# Patient Record
Sex: Female | Born: 1960 | Race: White | Hispanic: No | Marital: Married | Smoking: Former smoker
Health system: Southern US, Community
[De-identification: ages and names within clinical notes are randomized; demographics above are authoritative.]

## PROBLEM LIST (undated history)

## (undated) DIAGNOSIS — H3320 Serous retinal detachment, unspecified eye: Secondary | ICD-10-CM

## (undated) DIAGNOSIS — E7800 Pure hypercholesterolemia, unspecified: Secondary | ICD-10-CM

## (undated) DIAGNOSIS — E785 Hyperlipidemia, unspecified: Secondary | ICD-10-CM

## (undated) DIAGNOSIS — I4892 Unspecified atrial flutter: Secondary | ICD-10-CM

## (undated) HISTORY — PX: APPENDECTOMY: SHX54

## (undated) HISTORY — PX: TUBAL LIGATION: SHX77

## (undated) HISTORY — PX: TONSILLECTOMY: SUR1361

---
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
## 2004-04-17 ENCOUNTER — Other Ambulatory Visit: Admission: RE | Admit: 2004-04-17 | Discharge: 2004-04-17 | Payer: Self-pay | Admitting: Family Medicine

## 2005-03-27 ENCOUNTER — Emergency Department (HOSPITAL_COMMUNITY): Admission: EM | Admit: 2005-03-27 | Discharge: 2005-03-27 | Payer: Self-pay | Admitting: Emergency Medicine

## 2005-08-25 ENCOUNTER — Ambulatory Visit (HOSPITAL_COMMUNITY): Admission: RE | Admit: 2005-08-25 | Discharge: 2005-08-25 | Payer: Self-pay | Admitting: *Deleted

## 2005-12-01 ENCOUNTER — Encounter: Admission: RE | Admit: 2005-12-01 | Discharge: 2005-12-01 | Payer: Self-pay | Admitting: Family Medicine

## 2006-10-25 IMAGING — CR DG CHEST 1V PORT
1 series · 1 of 1 positions shown · non-contrast
Comparison: none

CLINICAL DATA: Chest pain. 
 PORTABLE CHEST - [id number] (7098 HOURS):
 No comparison.  
 Artifact overlies the chest.  Heart size is normal.   The mediastinum is unremarkable.   The lungs are clear.  No soft tissue or bony abnormality.

[view not recorded]
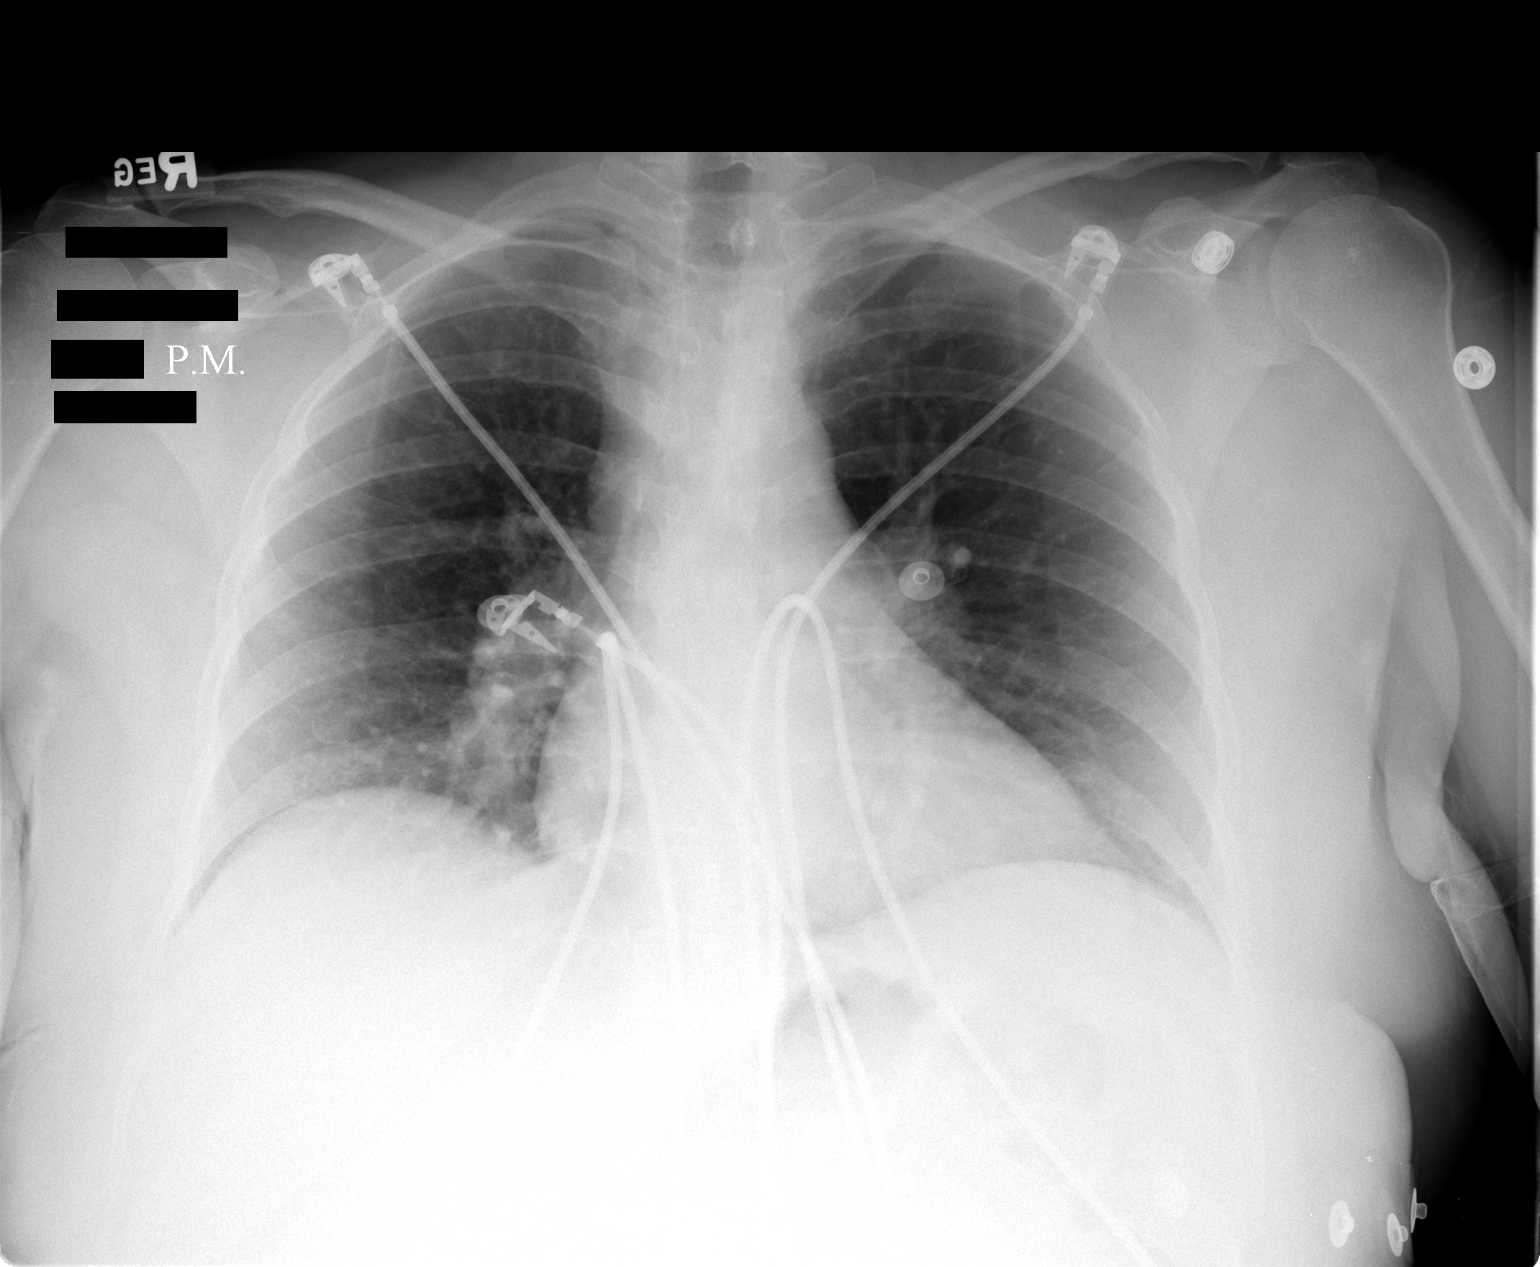

[1 of 1 positions shown; findings below may reference images not displayed]

IMPRESSION: 1.  No active disease.

## 2010-07-04 ENCOUNTER — Other Ambulatory Visit: Payer: Self-pay | Admitting: Family Medicine

## 2010-07-04 DIAGNOSIS — E049 Nontoxic goiter, unspecified: Secondary | ICD-10-CM

## 2010-07-07 ENCOUNTER — Ambulatory Visit
Admission: RE | Admit: 2010-07-07 | Discharge: 2010-07-07 | Disposition: A | Discharge status: Home / Self Care | Payer: Self-pay | Source: Ambulatory Visit | Attending: Family Medicine | Admitting: Family Medicine

## 2010-07-07 DIAGNOSIS — E049 Nontoxic goiter, unspecified: Secondary | ICD-10-CM

## 2010-08-01 DIAGNOSIS — J309 Allergic rhinitis, unspecified: Secondary | ICD-10-CM | POA: Insufficient documentation

## 2010-10-07 ENCOUNTER — Other Ambulatory Visit: Payer: Self-pay | Admitting: Family Medicine

## 2010-10-07 DIAGNOSIS — Z1231 Encounter for screening mammogram for malignant neoplasm of breast: Secondary | ICD-10-CM

## 2010-10-10 ENCOUNTER — Ambulatory Visit: Payer: BC Managed Care – PPO

## 2011-09-07 ENCOUNTER — Other Ambulatory Visit: Payer: Self-pay

## 2011-09-07 ENCOUNTER — Emergency Department (HOSPITAL_COMMUNITY)
Admission: EM | Admit: 2011-09-07 | Discharge: 2011-09-07 | Disposition: A | Discharge status: Home Health | Payer: BC Managed Care – PPO | Attending: Emergency Medicine | Admitting: Emergency Medicine

## 2011-09-07 ENCOUNTER — Encounter (HOSPITAL_COMMUNITY): Payer: Self-pay | Admitting: Emergency Medicine

## 2011-09-07 ENCOUNTER — Emergency Department (HOSPITAL_COMMUNITY): Payer: BC Managed Care – PPO

## 2011-09-07 DIAGNOSIS — I4891 Unspecified atrial fibrillation: Secondary | ICD-10-CM | POA: Insufficient documentation

## 2011-09-07 DIAGNOSIS — R0609 Other forms of dyspnea: Secondary | ICD-10-CM | POA: Insufficient documentation

## 2011-09-07 DIAGNOSIS — I498 Other specified cardiac arrhythmias: Secondary | ICD-10-CM | POA: Insufficient documentation

## 2011-09-07 DIAGNOSIS — E7800 Pure hypercholesterolemia, unspecified: Secondary | ICD-10-CM | POA: Insufficient documentation

## 2011-09-07 DIAGNOSIS — R42 Dizziness and giddiness: Secondary | ICD-10-CM | POA: Insufficient documentation

## 2011-09-07 DIAGNOSIS — R0989 Other specified symptoms and signs involving the circulatory and respiratory systems: Secondary | ICD-10-CM | POA: Insufficient documentation

## 2011-09-07 HISTORY — DX: Pure hypercholesterolemia, unspecified: E78.00

## 2011-09-07 LAB — BASIC METABOLIC PANEL
BUN: 15 mg/dL (ref 6–23)
Calcium: 9.7 mg/dL (ref 8.4–10.5)
Chloride: 105 mEq/L (ref 96–112)
Creatinine, Ser: 0.81 mg/dL (ref 0.50–1.10)
GFR calc Af Amer: >90 mL/min (ref >90)
Potassium: 4.1 mEq/L (ref 3.5–5.1)
Sodium: 141 mEq/L (ref 135–145)

## 2011-09-07 LAB — DIFFERENTIAL
Basophils Absolute: 0 10*3/uL (ref 0.0–0.1)
Basophils Relative: 0 % (ref 0–1)
Eosinophils Absolute: 0.1 10*3/uL (ref 0.0–0.7)
Lymphs Abs: 0.9 10*3/uL (ref 0.7–4.0)
Monocytes Absolute: 0.4 10*3/uL (ref 0.1–1.0)
Monocytes Relative: 5 % (ref 3–12)
Neutro Abs: 5.9 10*3/uL (ref 1.7–7.7)
Neutrophils Relative %: 80 % — ABNORMAL HIGH (ref 43–77)

## 2011-09-07 LAB — CBC
HCT: 42.9 % (ref 36.0–46.0)
Hemoglobin: 14.6 g/dL (ref 12.0–15.0)
MCHC: 34 g/dL (ref 30.0–36.0)
Platelets: 289 10*3/uL (ref 150–400)
RBC: 4.73 MIL/uL (ref 3.87–5.11)
RDW: 13.1 % (ref 11.5–15.5)
WBC: 7.4 10*3/uL (ref 4.0–10.5)

## 2011-09-07 LAB — CARDIAC PANEL(CRET KIN+CKTOT+MB+TROPI)
CK, MB: 2.8 ng/mL (ref 0.3–4.0)
Relative Index: INVALID (ref 0.0–2.5)
Total CK: 99 U/L (ref 7–177)
Troponin I: <0.3 ng/mL (ref <0.30)

## 2011-09-07 LAB — TSH: TSH: 0.969 u[IU]/mL (ref 0.350–4.500)

## 2011-09-07 MED ORDER — DILTIAZEM HCL 100 MG IV SOLR
5.0000 mg/h | Freq: Once | INTRAVENOUS | Status: AC
  Administered 2011-09-07: 5 mg/h via INTRAVENOUS
  Filled 2011-09-07: qty 100

## 2011-09-07 MED ORDER — ENOXAPARIN SODIUM 100 MG/ML ~~LOC~~ SOLN
100.0000 mg | SUBCUTANEOUS | Status: AC
  Administered 2011-09-07: 100 mg via SUBCUTANEOUS
  Filled 2011-09-07: qty 1

## 2011-09-07 MED ORDER — FLECAINIDE ACETATE 100 MG PO TABS
300.0000 mg | ORAL_TABLET | ORAL | Status: AC
  Administered 2011-09-07: 300 mg via ORAL
  Filled 2011-09-07: qty 3

## 2011-09-07 NOTE — ED Provider Notes (Signed)
History     CSN: [id number]  Arrival date & time 09/07/11  0865   First MD Initiated Contact with Patient 09/07/11 0848      Chief Complaint  Patient presents with  . Irregular Heart Beat    (Consider location/radiation/quality/duration/timing/severity/associated sxs/prior treatment) The history is provided by the patient.   51 year old female woke up from a nap at 4 PM yesterday and noted that her heart was racing and fluttering. There is associated dyspnea and lightheadedness. Dyspnea is worse with walking, but nothing affected the rapid heartbeat. She denies chest pain, heaviness, tightness, pressure. She denies nausea, vomiting, diaphoresis. She's never had anything like this before. Past medical history is significant only for elevated cholesterol. She relates that one time she had a slightly elevated thyroid test and was given vitamin and the repeat test was normal. Symptoms are described as moderate to severe. Nothing makes it better and nothing makes it worse.  Past Medical History  Diagnosis Date  . High cholesterol     No past surgical history on file.  No family history on file.  History  Substance Use Topics  . Smoking status: Never Smoker   . Smokeless tobacco: Not on file  . Alcohol Use: Yes    OB History    Grav Para Term Preterm Abortions TAB SAB Ect Mult Living                  Review of Systems  All other systems reviewed and are negative.    Allergies  Review of patient's allergies indicates not on file.  Home Medications  No current outpatient prescriptions on file.  BP 136/99  Pulse 152  Temp 98.1 F (36.7 C)  Resp 20  SpO2 100%  Physical Exam  Nursing note and vitals reviewed.  51 year old female is resting comfortably and in no acute distress. Vital signs are significant for tachycardia with heart rate 152, and borderline hypertension with blood pressure 136/99. Oxygen saturation is 100% which is normal. Head is normocephalic and  atraumatic. PERRLA, EOMI. Oropharynx is clear. Neck is nontender and supple without adenopathy or JVD. Back is nontender. Lungs are clear without rales, wheezes, or rhonchi. Heart is tachycardic without murmur. Abdomen is soft, flat, nontender without masses or hepatosplenomegaly. Extremities have trace edema, no cyanosis. Full range of motion is present. Skin is warm and dry without rash. Neurologic: Mental status is normal, cranial nerves are intact, there no focal motor or sensory deficits.  ED Course  Procedures (including critical care time)   Labs Reviewed  CBC  DIFFERENTIAL  BASIC METABOLIC PANEL  TSH  CARDIAC PANEL(CRET KIN+CKTOT+MB+TROPI)   No results found.   Date: 09/07/2011  Rate: 148  Rhythm: atrial fibrillation  QRS Axis: normal  Intervals: normal  ST/T Wave abnormalities: nonspecific ST changes  Conduction Disutrbances:none  Narrative Interpretation:   Old EKG Reviewed: none available  She received a dose of oral flecainide and was observed in the emergency department. She had successful conversion to sinus rhythm although was sinus tachycardia. She was observed in the emergency department and rhythm was stable and she will be discharged. TSH was ordered and results are not back yet. She's advised to make sure her primary care physician looked up this test result. She's also advised to discuss with her PCP what additional workup is needed and possible referral to a cardiologist.  1. Atrial fibrillation       MDM  New onset atrial fibrillation/atrial flutter. Symptoms have been present for less  than 24 hours so she would be a good candidate for flexion I conversion. She will be given to Diltiazem for rate control and and in the single dose of Lovenox and a dose of oral Flecainide and observed.        Dione Booze, MD 09/07/11 [phone]

## 2011-09-07 NOTE — ED Notes (Signed)
Discharge instructions reviewed with pt; verbalizes understanding.  No further c/o's voiced; No further questions asked.  Pt ambulatory to lobby with friend.

## 2011-09-07 NOTE — ED Notes (Signed)
Woke up from nap yesterday  Had fast heart rate over 135  Feels dizzy and sob

## 2011-09-07 NOTE — ED Notes (Addendum)
Pt from home.  Reports being tired yesterday, taking a nap and when she woke up, she had a racing HR.  Pt reports SOB, dizziness and lightheadedness.  Reports a fluttering in her throat and chest.  Denies CP, N/V/diaphoreis.  No distress noted.  Pt A/O x 4.  Pt nontender on palpation.

## 2011-09-07 NOTE — Discharge Instructions (Signed)
You had a blood test of your thyroid gland drawn today. Please make sure you're primary care Dr. Assunta Gambles up this blood test results. Make an appointment with your Dr. for followup. There are some other tests which need to be done to see if we can find a reason why you had irregular heartbeat today. If you have any problems, please return to the emergency department for further evaluation.  Atrial Fibrillation Your caregiver has diagnosed you with atrial fibrillation (AFib). The heart normally beats very regularly; AFib is a type of irregular heartbeat. The heart rate may be faster or slower than normal. This can prevent your heart from pumping as well as it should. AFib can be constant (chronic) or intermittent (paroxysmal). CAUSES  Atrial fibrillation may be caused by:  Heart disease, including heart attack, coronary artery disease, heart failure, diseases of the heart valves, and others.   Blood clot in the lungs (pulmonary embolism).   Pneumonia or other infections.   Chronic lung disease.   Thyroid disease.   Toxins. These include alcohol, some medications (such as decongestant medications or diet pills), and caffeine.  In some people, no cause for AFib can be found. This is referred to as Lone Atrial Fibrillation. SYMPTOMS   Palpitations or a fluttering in your chest.   A vague sense of chest discomfort.   Shortness of breath.   Sudden onset of lightheadedness or weakness.  Sometimes, the first sign of AFib can be a complication of the condition. This could be a stroke or heart failure. DIAGNOSIS  Your description of your condition may make your caregiver suspicious of atrial fibrillation. Your caregiver will examine your pulse to determine if fibrillation is present. An EKG (electrocardiogram) will confirm the diagnosis. Further testing may help determine what caused you to have atrial fibrillation. This may include chest x-ray, echocardiogram, blood tests, or CT scans. PREVENTION   If you have previously had atrial fibrillation, your caregiver may advise you to avoid substances known to cause the condition (such as stimulant medications, and possibly caffeine or alcohol). You may be advised to use medications to prevent recurrence. Proper treatment of any underlying condition is important to help prevent recurrence. PROGNOSIS  Atrial fibrillation does tend to become a chronic condition over time. It can cause significant complications (see below). Atrial fibrillation is not usually immediately life-threatening, but it can shorten your life expectancy. This seems to be worse in women. If you have lone atrial fibrillation and are under 14 years old, the risk of complications is very low, and life expectancy is not shortened. RISKS AND COMPLICATIONS  Complications of atrial fibrillation can include stroke, chest pain, and heart failure. Your caregiver will recommend treatments for the atrial fibrillation, as well as for any underlying conditions, to help minimize risk of complications. TREATMENT  Treatment for AFib is divided into several categories:  Treatment of any underlying condition.   Converting you out of AFib into a regular (sinus) rhythm.   Controlling rapid heart rate.   Prevention of blood clots and stroke.  Medications and procedures are available to convert your atrial fibrillation to sinus rhythm. However, recent studies have shown that this may not offer you any advantage, and cardiac experts are continuing research and debate on this topic. More important is controlling your rapid heartbeat. The rapid heartbeat causes more symptoms, and places strain on your heart. Your caregiver will advise you on the use of medications that can control your heart rate. Atrial fibrillation is a strong stroke  risk. You can lessen this risk by taking blood thinning medications such as Coumadin (warfarin), or sometimes aspirin. These medications need close monitoring by your  caregiver. Over-medication can cause bleeding. Too little medication may not protect against stroke. HOME CARE INSTRUCTIONS   If your caregiver prescribed medicine to make your heartbeat more normally, take as directed.   If blood thinners were prescribed by your caregiver, take EXACTLY as directed.   Perform blood tests EXACTLY as directed.   Quit smoking. Smoking increases your cardiac and lung (pulmonary) risks.   DO NOT drink alcohol.   DO NOT drink caffeinated drinks (e.g. coffee, soda, chocolate, and leaf teas). You may drink decaffeinated coffee, soda or tea.   If you are overweight, you should choose a reduced calorie diet to lose weight. Please see a registered dietitian if you need more information about healthy weight loss. DO NOT USE DIET PILLS as they may aggravate heart problems.   If you have other heart problems that are causing AFib, you may need to eat a low salt, fat, and cholesterol diet. Your caregiver will tell you if this is necessary.   Exercise every day to improve your physical fitness. Stay active unless advised otherwise.   If your caregiver has given you a follow-up appointment, it is very important to keep that appointment. Not keeping the appointment could result in heart failure or stroke. If there is any problem keeping the appointment, you must call back to this facility for assistance.  SEEK MEDICAL CARE IF:  You notice a change in the rate, rhythm or strength of your heartbeat.   You develop an infection or any other change in your overall health status.  SEEK IMMEDIATE MEDICAL CARE IF:   You develop chest pain, abdominal pain, sweating, weakness or feel sick to your stomach (nausea).   You develop shortness of breath.   You develop swollen feet and ankles.   You develop dizziness, numbness, or weakness of your face or limbs, or any change in vision or speech.  MAKE SURE YOU:   Understand these instructions.   Will watch your condition.    Will get help right away if you are not doing well or get worse.  Document Released: 04/20/2005 Document Revised: 04/09/2011 Document Reviewed: 11/23/2007 [facility] Patient Information 2012 [place], [place].

## 2011-09-08 ENCOUNTER — Observation Stay (HOSPITAL_COMMUNITY)
Admission: EM | Admit: 2011-09-08 | Discharge: 2011-09-10 | DRG: 139 | Disposition: A | Discharge status: Home / Self Care | Payer: BC Managed Care – PPO | Attending: Cardiovascular Disease | Admitting: Cardiovascular Disease

## 2011-09-08 ENCOUNTER — Encounter (HOSPITAL_COMMUNITY): Payer: Self-pay | Admitting: *Deleted

## 2011-09-08 DIAGNOSIS — Z8249 Family history of ischemic heart disease and other diseases of the circulatory system: Secondary | ICD-10-CM

## 2011-09-08 DIAGNOSIS — E785 Hyperlipidemia, unspecified: Secondary | ICD-10-CM

## 2011-09-08 DIAGNOSIS — F329 Major depressive disorder, single episode, unspecified: Secondary | ICD-10-CM | POA: Insufficient documentation

## 2011-09-08 DIAGNOSIS — I4892 Unspecified atrial flutter: Principal | ICD-10-CM | POA: Diagnosis present

## 2011-09-08 DIAGNOSIS — F3289 Other specified depressive episodes: Secondary | ICD-10-CM | POA: Insufficient documentation

## 2011-09-08 DIAGNOSIS — Z7982 Long term (current) use of aspirin: Secondary | ICD-10-CM | POA: Insufficient documentation

## 2011-09-08 DIAGNOSIS — F339 Major depressive disorder, recurrent, unspecified: Secondary | ICD-10-CM | POA: Diagnosis present

## 2011-09-08 HISTORY — DX: Unspecified atrial flutter: I48.92

## 2011-09-08 HISTORY — DX: Hyperlipidemia, unspecified: E78.5

## 2011-09-08 LAB — BASIC METABOLIC PANEL
CO2: 26 mEq/L (ref 19–32)
Calcium: 10.4 mg/dL (ref 8.4–10.5)
Chloride: 103 mEq/L (ref 96–112)
GFR calc Af Amer: >90 mL/min (ref >90)
GFR calc non Af Amer: 83 mL/min — ABNORMAL LOW (ref >90)
Potassium: 3.9 mEq/L (ref 3.5–5.1)
Sodium: 139 mEq/L (ref 135–145)

## 2011-09-08 LAB — HEPATIC FUNCTION PANEL
ALT: 20 U/L (ref 0–35)
AST: 16 U/L (ref 0–37)
Albumin: 4 g/dL (ref 3.5–5.2)
Alkaline Phosphatase: 60 U/L (ref 39–117)
Total Protein: 6.8 g/dL (ref 6.0–8.3)

## 2011-09-08 LAB — CBC
HCT: 43.9 % (ref 36.0–46.0)
MCH: 30.9 pg (ref 26.0–34.0)
MCHC: 34.4 g/dL (ref 30.0–36.0)
MCV: 90 fL (ref 78.0–100.0)
Platelets: 306 10*3/uL (ref 150–400)
RBC: 4.88 MIL/uL (ref 3.87–5.11)
RDW: 13 % (ref 11.5–15.5)
WBC: 6 10*3/uL (ref 4.0–10.5)

## 2011-09-08 LAB — MRSA PCR SCREENING: MRSA by PCR: NEGATIVE

## 2011-09-08 LAB — POCT I-STAT TROPONIN I: Troponin i, poc: 0 ng/mL (ref 0.00–0.08)

## 2011-09-08 LAB — CARDIAC PANEL(CRET KIN+CKTOT+MB+TROPI)
CK, MB: 2.3 ng/mL (ref 0.3–4.0)
Total CK: 56 U/L (ref 7–177)

## 2011-09-08 LAB — APTT: aPTT: 35 seconds (ref 24–37)

## 2011-09-08 MED ORDER — VENLAFAXINE HCL ER 150 MG PO CP24
150.0000 mg | ORAL_CAPSULE | Freq: Two times a day (BID) | ORAL | Status: DC
  Administered 2011-09-08 – 2011-09-10 (×4): 150 mg via ORAL
  Filled 2011-09-08 (×5): qty 1

## 2011-09-08 MED ORDER — SODIUM CHLORIDE 0.9 % IV BOLUS (SEPSIS)
500.0000 mL | Freq: Once | INTRAVENOUS | Status: AC
  Administered 2011-09-08: 500 mL via INTRAVENOUS

## 2011-09-08 MED ORDER — ACETAMINOPHEN 325 MG PO TABS: 650.0000 mg | ORAL_TABLET | ORAL | Status: DC | PRN

## 2011-09-08 MED ORDER — ASPIRIN 81 MG PO CHEW
324.0000 mg | CHEWABLE_TABLET | ORAL | Status: AC
  Administered 2011-09-08: 324 mg via ORAL
  Filled 2011-09-08: qty 4

## 2011-09-08 MED ORDER — METOPROLOL TARTRATE 1 MG/ML IV SOLN
5.0000 mg | Freq: Once | INTRAVENOUS | Status: AC
  Administered 2011-09-08: 5 mg via INTRAVENOUS
  Filled 2011-09-08: qty 5

## 2011-09-08 MED ORDER — DIGOXIN 0.25 MG/ML IJ SOLN
0.5000 mg | Freq: Once | INTRAMUSCULAR | Status: AC
  Administered 2011-09-08: 0.5 mg via INTRAVENOUS
  Filled 2011-09-08: qty 2

## 2011-09-08 MED ORDER — METOPROLOL TARTRATE 12.5 MG HALF TABLET
12.5000 mg | ORAL_TABLET | Freq: Two times a day (BID) | ORAL | Status: DC
  Administered 2011-09-08 – 2011-09-09 (×2): 12.5 mg via ORAL
  Filled 2011-09-08 (×3): qty 1

## 2011-09-08 MED ORDER — HEPARIN BOLUS VIA INFUSION
4000.0000 [IU] | Freq: Once | INTRAVENOUS | Status: AC
  Administered 2011-09-08: 4000 [IU] via INTRAVENOUS

## 2011-09-08 MED ORDER — ASPIRIN 300 MG RE SUPP
300.0000 mg | RECTAL | Status: AC
  Filled 2011-09-08: qty 1

## 2011-09-08 MED ORDER — ZOLPIDEM TARTRATE 5 MG PO TABS: 10.0000 mg | ORAL_TABLET | Freq: Every evening | ORAL | Status: DC | PRN

## 2011-09-08 MED ORDER — ALPRAZOLAM 0.25 MG PO TABS
0.2500 mg | ORAL_TABLET | Freq: Three times a day (TID) | ORAL | Status: DC | PRN
Start: 2011-09-08 — End: 2011-09-10

## 2011-09-08 MED ORDER — NITROGLYCERIN 0.4 MG SL SUBL: 0.4000 mg | SUBLINGUAL_TABLET | SUBLINGUAL | Status: DC | PRN

## 2011-09-08 MED ORDER — ASPIRIN EC 81 MG PO TBEC
81.0000 mg | DELAYED_RELEASE_TABLET | Freq: Every day | ORAL | Status: DC
  Administered 2011-09-09 – 2011-09-10 (×2): 81 mg via ORAL
  Filled 2011-09-08 (×2): qty 1

## 2011-09-08 MED ORDER — SODIUM CHLORIDE 0.9 % IV SOLN
INTRAVENOUS | Status: DC
  Administered 2011-09-08: 10 mL/h via INTRAVENOUS

## 2011-09-08 MED ORDER — DILTIAZEM LOAD VIA INFUSION
20.0000 mg | Freq: Once | INTRAVENOUS | Status: AC
  Administered 2011-09-08: 20 mg via INTRAVENOUS
  Filled 2011-09-08: qty 20

## 2011-09-08 MED ORDER — HEPARIN (PORCINE) IN NACL 100-0.45 UNIT/ML-% IJ SOLN
1600.0000 [IU]/h | INTRAMUSCULAR | Status: DC
  Administered 2011-09-08: 1350 [IU]/h via INTRAVENOUS
  Administered 2011-09-09: 1600 [IU]/h via INTRAVENOUS
  Filled 2011-09-08 (×3): qty 250

## 2011-09-08 MED ORDER — DILTIAZEM HCL 100 MG IV SOLR
5.0000 mg/h | INTRAVENOUS | Status: DC
  Administered 2011-09-08: 15 mg/h via INTRAVENOUS
  Administered 2011-09-08: 10 mg/h via INTRAVENOUS
  Administered 2011-09-08: 15 mg/h via INTRAVENOUS
  Administered 2011-09-08: 5 mg/h via INTRAVENOUS
  Administered 2011-09-09 (×2): 15 mg/h via INTRAVENOUS
  Filled 2011-09-08: qty 100

## 2011-09-08 MED ORDER — ONDANSETRON HCL 4 MG/2ML IJ SOLN: 4.0000 mg | Freq: Four times a day (QID) | INTRAMUSCULAR | Status: DC | PRN

## 2011-09-08 MED ORDER — DIGOXIN 250 MCG PO TABS
0.2500 mg | ORAL_TABLET | Freq: Every day | ORAL | Status: DC
  Administered 2011-09-09: 0.25 mg via ORAL
  Filled 2011-09-08: qty 1

## 2011-09-08 MED ORDER — FENOFIBRATE 160 MG PO TABS
160.0000 mg | ORAL_TABLET | Freq: Every day | ORAL | Status: DC
  Administered 2011-09-08 – 2011-09-09 (×2): 160 mg via ORAL
  Filled 2011-09-08 (×3): qty 1

## 2011-09-08 NOTE — ED Notes (Signed)
Patient states she was seen here yesterday for tachycardia.  She was sent home and was to go to her md today.  Patient states today she noted she felt like she was going to pass out and her heartrate was elevated.  She also reports she is feeling sob

## 2011-09-08 NOTE — ED Notes (Signed)
2926-01 Ready 

## 2011-09-08 NOTE — ED Notes (Signed)
Pt.heart  Rate gose up in 134 than drop down to 108

## 2011-09-08 NOTE — ED Notes (Signed)
Patient heartrate varies and up to 135,  Acuity changed

## 2011-09-08 NOTE — H&P (Signed)
[name] [name] is an 51 y.o. female.   PCP: Dr. Maryelizabeth Rowan  Chief Complaint: rapid heart rate with associated dizziness HPI:  51 year old white female presents back to the emergency room today with a flutter with rapid ventricular response and associated dizziness. She was in the emergency room yesterday after 24 hours of increased heart rate and not feeling well.  She was in atrial flutter was given flecinide oral bolus And ER physician documents returned to sinus rhythm.  At home she did have continued tachycardia she rested all day this morning until noon she was to see her primary care physician she stood up she was excessively dizzy and came to the emergency room. She is in atrial flutter with rapid ventricular response heart rate up to 150s at times. Chest no chest pressure but awareness of fluttering in her neck some shortness of breath this is also associated with this.  She has been given him 20 mg bolus of IV Cardizem drip currently at 5 mg and we will increase to 10 mg. She will receive 500 cc bolus of saline and IV heparin per pharmacy protocol.  Previously occasional flutter but no rapid heart rate episodes. No chest pain.  She does have a history of high cholesterol and a strong family history of coronary disease with a brother who died suddenly at 2 with an acute MI her father also died with a coronary artery disease and pneumonia but he had coronary artery bypass grafting and redo bypass grafting and multiple stents after the redo.     Past Medical History  Diagnosis Date  . High cholesterol   . Atrial flutter with rapid ventricular response 09/08/2011  . Hyperlipemia 09/08/2011    History reviewed. No pertinent past surgical history.  Family History  Problem Relation Age of Onset  . Stroke Mother   . Hypertension Mother   . Coronary artery disease Father   . Coronary artery disease Brother    Social History:  reports that she quit smoking about 10 years ago. She has  never used smokeless tobacco. She reports that she drinks about 1.2 ounces of alcohol per week. Her drug history not on file. She lives with a partner, she has 4 children. 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  Allergies:  Allergies  Allergen Reactions  . Wellbutrin (Bupropion) Anaphylaxis    Outpatient medications:  Fenfibrate Imdur 60 mg one at bedtime Effexor XR 150 mg 2 tablets daily  Results for orders placed during the hospital encounter of 09/08/11 (from the past 48 hour(s))  CBC     Status: Abnormal   Collection Time   09/08/11  1:45 PM      Component Value Range Comment   WBC 6.0  4.0 - 10.5 (K/uL)    RBC 4.88  3.87 - 5.11 (MIL/uL)    Hemoglobin 15.1 (*) 12.0 - 15.0 (g/dL)    HCT 45.4  09.8 - 11.9 (%)    MCV 90.0  78.0 - 100.0 (fL)    MCH 30.9  26.0 - 34.0 (pg)    MCHC 34.4  30.0 - 36.0 (g/dL)    RDW 14.7  82.9 - 56.2 (%)    Platelets 306  150 - 400 (K/uL)   BASIC METABOLIC PANEL     Status: Abnormal   Collection Time   09/08/11  1:45 PM      Component Value Range Comment   Sodium 139  135 - 145 (mEq/L)    Potassium 3.9  3.5 - 5.1 (mEq/L)    Chloride 103  96 - 112 (mEq/L)    CO2 26  19 - 32 (mEq/L)    Glucose, Bld 97  70 - 99 (mg/dL)    BUN 13  6 - 23 (mg/dL)    Creatinine, Ser 1.61  0.50 - 1.10 (mg/dL)    Calcium 09.6  8.4 - 10.5 (mg/dL)    GFR calc non Af Amer 83 (*) >90 (mL/min)    GFR calc Af Amer >90  >90 (mL/min)   POCT I-STAT TROPONIN I     Status: Normal   Collection Time   09/08/11  2:10 PM      Component Value Range Comment   Troponin i, poc 0.00  0.00 - 0.08 (ng/mL)    Comment 3             Dg Chest Portable 1 View  09/07/2011  *RADIOLOGY REPORT*  Clinical Data: Irregular heart beat  CHEST - 1 VIEW  Comparison:  03/27/2005  Findings: The heart size and mediastinal contours are within normal limits.  Both lungs are clear.  IMPRESSION: No active disease.  Original Report Authenticated By: Judie Petit.  Ruel Favors, M.D.    ROS: General:No recent colds or fevers Skin:No rashes or ulcers HEENT:No blurred vision or double vision CV:No chest pain but awareness of rapid heart rate EAV:WUJWJXBJY of breath is present with rapid heart rate but otherwise has not had shortness of breath GI:No diarrhea constipation or melena GU:No dysuria or hematuria MS:No musculoskeletal issues or joint pain Neuro:Positive dizziness today but no syncope Endo:No diabetes or thyroid disease    Blood pressure 119/79, pulse 140, temperature 98.4 F (36.9 C), temperature source Oral, resp. rate 21, height 5\' 7"  (1.702 m), weight 97.523 kg (215 lb), SpO2 96.00%. PE: General:Alert and oriented x3, pleasant affect no acute distress but obviously does not feel well Skin:Warm and dry brisk capillary refill HEENT:Normocephalic, sclera clear Neck:Supple no JVD no bruits Heart:S1-S2 rapid rate and irregular Lungs:Clear without rales rhonchi or wheezes NWG:NFAOZHYQ bowel sounds do not palpate liver spleen or masses Ext:No lower extremity edema 2+ pedal pulses bilaterally Neuro:Alert and oriented x3 follows commands moves all extremities    Assessment/Plan Patient Active Problem List  Diagnoses  . Atrial flutter with rapid ventricular response  . Hyperlipemia   PLAN: IV cardizem, Heparin, slow rate and plan for TEE/DCCV in AM,  Serial ckmbs.  MVHQIO,NGEXB R 09/08/2011, 4:31 PM   Agree with note written by Nada Boozer RNP  Agree with LI RNP. Pt with new onset aflutter with 2:1 conduction. CRF + for lipids and strong family history. She was seen in ER yesterday and converted with po flecainide. Symptoms recurred today. Denies cp or SOB. Exam benign. EKG aflutter. Labs OK. Plan to slow with iv dilt and BB, put on iv hep and cycle enzymes. Wll do TEE DCCV tomorrow. Will ultimately need to be put on BB and have an OP ischemia w/u (myoview).  Runell Gess 09/08/2011 6:20 PM

## 2011-09-08 NOTE — Progress Notes (Signed)
ANTICOAGULATION CONSULT NOTE - Initial Consult  Pharmacy Consult for heparin Indication: atrial fibrillation  Allergies  Allergen Reactions  . Wellbutrin (Bupropion) Anaphylaxis    Patient Measurements: Height: 5\' 7"  (170.2 cm) Weight: 215 lb (97.523 kg) IBW/kg (Calculated) : 61.6  Heparin Dosing Weight: 83.2 kg  Vital Signs: Temp: 98.4 F (36.9 C) (05/07 1241) Temp src: Oral (05/07 1241) BP: 119/79 mmHg (05/07 1607) Pulse Rate: 140  (05/07 1607)  Labs:  Basename 09/08/11 1345 09/07/11 0907  HGB 15.1* 14.6  HCT 43.9 42.9  PLT 306 289  APTT -- --  LABPROT -- --  INR -- --  HEPARINUNFRC -- --  CREATININE 0.81 0.81  CKTOTAL -- 99  CKMB -- 2.8  TROPONINI -- <0.30    Estimated Creatinine Clearance: 98.6 ml/min (by C-G formula based on Cr of 0.81).   Medical History: Past Medical History  Diagnosis Date  . High cholesterol   . Atrial flutter with rapid ventricular response 09/08/2011  . Hyperlipemia 09/08/2011    Medications:  Fenofibrate 160 mg qhs Effexor XR 150 mg bid   Assessment: 51 yo female in ED yesterday with afib and got lovenox 100mg  5/6 at 1030am back in the ED with symptomatic afib with lightheadedness, elevated heart rate and SOB to start IV heparin per pharmacy protocol.  CBC wnl.    Goal of Therapy:  Heparin level 0.3 - 0.7    Plan:  1. Heparin 4000 unit bolus 2. Heparin drip at 1350 units/hr 3. Check heparin level in 6 hours or with next cardiac enzyme 4. Daily heparin level and daily CBC with platelet count while on heaprin Herby Abraham, Pharm.D. [phone] 09/08/2011 4:52 PM    Len Childs T 09/08/2011,4:41 PM

## 2011-09-08 NOTE — ED Provider Notes (Signed)
History     CSN: [id number]  Arrival date & time 09/08/11  1239   First MD Initiated Contact with Patient 09/08/11 1325      Chief Complaint  Patient presents with  . Tachycardia  . Near Syncope    (Consider location/radiation/quality/duration/timing/severity/associated sxs/prior treatment) The history is provided by the patient, a friend and medical records.  51 y/o F with PMH HLD and new diagnosis of atrial fibrillation in the emergency department yesterday presents to the emergency department with complaints of lightheadedness, elevated heart rate, and shortness of breath with exertion. There is associated mild heaviness in the chest but she feels is secondary to her shortness of breath. She denies any chest pain. Denies nausea, vomiting, or diaphoresis. Nothing in particular makes her symptoms better or worse. Prior record indicates that she was given diltiazem in the emergency department yesterday for cardioversion. She was given an oral dose of flecainide as well as a single dose of Lovenox. TSH was checked as patient has a history of hyperthyroid, and is found to be normal. Was advised to follow up with her primary care Dr. Patient reports that she he was supposed to see her doctor at 1 PM today but did not feel well and go to the appointment.   Past Medical History  Diagnosis Date  . High cholesterol     History reviewed. No pertinent past surgical history.  No family history on file.  History  Substance Use Topics  . Smoking status: Never Smoker   . Smokeless tobacco: Not on file  . Alcohol Use: Yes     Review of Systems 10 systems reviewed and are negative for acute change except as noted in the HPI.  Allergies  Wellbutrin  Home Medications   Current Outpatient Rx  Name Route Sig Dispense Refill  . FENOFIBRATE 160 MG PO TABS Oral Take 160 mg by mouth at bedtime.     . VENLAFAXINE HCL ER 150 MG PO CP24 Oral Take 150 mg by mouth 2 (two) times daily.      BP  120/66  Pulse 101  Temp(Src) 98.4 F (36.9 C) (Oral)  Resp 21  Ht 5\' 7"  (1.702 m)  Wt 215 lb (97.523 kg)  BMI 33.67 kg/m2  SpO2 95%  Physical Exam  Constitutional: She is oriented to person, place, and time. She appears well-developed and well-nourished. No distress.  HENT:  Head: Normocephalic and atraumatic.  Right Ear: External ear normal.  Left Ear: External ear normal.  Mouth/Throat: Oropharynx is clear and moist.  Eyes: Conjunctivae are normal. Pupils are equal, round, and reactive to light.  Neck: Neck supple.  Cardiovascular: Intact distal pulses.  Tachycardia present.        Rhythm generally regular with occasional extra beat heard  Pulmonary/Chest: Effort normal and breath sounds normal. No respiratory distress. She has no wheezes. She exhibits no tenderness.  Abdominal: Soft. Bowel sounds are normal. She exhibits no distension. There is no tenderness.  Musculoskeletal: She exhibits no edema and no tenderness.       Calves supple and non-tender  Neurological: She is alert and oriented to person, place, and time.  Skin: Skin is warm and dry.    ED Course  Procedures (including critical care time)  Labs Reviewed  CBC - Abnormal; Notable for the following:    Hemoglobin 15.1 (*)    All other components within normal limits  BASIC METABOLIC PANEL - Abnormal; Notable for the following:    GFR calc non Af  Amer 60 (*)    All other components within normal limits  POCT I-STAT TROPONIN I   Dg Chest Portable 1 View  09/07/2011  *RADIOLOGY REPORT*  Clinical Data: Irregular heart beat  CHEST - 1 VIEW  Comparison:  03/27/2005  Findings: The heart size and mediastinal contours are within normal limits.  Both lungs are clear.  IMPRESSION: No active disease.  Original Report Authenticated By: Judie Petit. Ruel Favors, M.D.    Date: 09/08/2011, 12:43 pm  Rate: 145  Rhythm: atrial flutter  QRS Axis: normal  Intervals: normal  ST/T Wave abnormalities: nonspecific ST changes  Conduction  Disutrbances:none  Old EKG Reviewed: unchanged from 09/07/2011   Date: 09/08/2011, 3:12pm  Rate: 141  Rhythm: atrial flutter  QRS Axis: normal  Intervals: normal  ST/T Wave abnormalities: nonspecific ST changes  Conduction Disutrbances:none  Old EKG Reviewed: unchanged      Dx 1: Atrial flutter   MDM  A-fib dx yesterday. EKG today more c/w a-flutter. Cardizem bolus/drip ordered with initial rate improvement but soon returned to 140s, with regular rhythm, c/w NSR vs 2:1 flutter. Labs today unremarkable. Spoke with laura, NP for [facility], who will see pt in ED.        Shaaron Adler, [place] 09/08/11 1542

## 2011-09-09 ENCOUNTER — Encounter (HOSPITAL_COMMUNITY): Payer: Self-pay | Admitting: Anesthesiology

## 2011-09-09 ENCOUNTER — Ambulatory Visit (HOSPITAL_COMMUNITY): Admit: 2011-09-09 | Payer: Self-pay | Admitting: Cardiovascular Disease

## 2011-09-09 ENCOUNTER — Encounter (HOSPITAL_COMMUNITY): Payer: Self-pay

## 2011-09-09 ENCOUNTER — Encounter (HOSPITAL_COMMUNITY)
Admission: EM | Disposition: A | Discharge status: Home / Self Care | Payer: Self-pay | Source: Home / Self Care | Attending: Emergency Medicine

## 2011-09-09 ENCOUNTER — Inpatient Hospital Stay (HOSPITAL_COMMUNITY): Payer: BC Managed Care – PPO | Admitting: Anesthesiology

## 2011-09-09 DIAGNOSIS — F339 Major depressive disorder, recurrent, unspecified: Secondary | ICD-10-CM | POA: Diagnosis present

## 2011-09-09 DIAGNOSIS — Z8249 Family history of ischemic heart disease and other diseases of the circulatory system: Secondary | ICD-10-CM

## 2011-09-09 HISTORY — PX: TEE WITHOUT CARDIOVERSION: SHX5443

## 2011-09-09 LAB — CARDIAC PANEL(CRET KIN+CKTOT+MB+TROPI)
CK, MB: 1.9 ng/mL (ref 0.3–4.0)
Relative Index: INVALID (ref 0.0–2.5)
Total CK: 51 U/L (ref 7–177)
Total CK: 52 U/L (ref 7–177)
Troponin I: <0.3 ng/mL (ref <0.30)

## 2011-09-09 LAB — BASIC METABOLIC PANEL
BUN: 15 mg/dL (ref 6–23)
CO2: 27 mEq/L (ref 19–32)
Glucose, Bld: 100 mg/dL — ABNORMAL HIGH (ref 70–99)
Potassium: 4.3 mEq/L (ref 3.5–5.1)
Sodium: 142 mEq/L (ref 135–145)

## 2011-09-09 LAB — LIPID PANEL
Cholesterol: 280 mg/dL — ABNORMAL HIGH (ref 0–200)
Triglycerides: 217 mg/dL — ABNORMAL HIGH (ref <150)

## 2011-09-09 LAB — HEPARIN LEVEL (UNFRACTIONATED)
Heparin Unfractionated: 0.26 IU/mL — ABNORMAL LOW (ref 0.30–0.70)
Heparin Unfractionated: 0.71 IU/mL — ABNORMAL HIGH (ref 0.30–0.70)

## 2011-09-09 LAB — HEMOGLOBIN A1C: Mean Plasma Glucose: 131 mg/dL — ABNORMAL HIGH (ref <117)

## 2011-09-09 LAB — CBC
HCT: 43.5 % (ref 36.0–46.0)
Hemoglobin: 14.9 g/dL (ref 12.0–15.0)
MCH: 31.1 pg (ref 26.0–34.0)
RBC: 4.79 MIL/uL (ref 3.87–5.11)

## 2011-09-09 SURGERY — ECHOCARDIOGRAM, TRANSESOPHAGEAL
Anesthesia: Moderate Sedation

## 2011-09-09 MED ORDER — HEPARIN BOLUS VIA INFUSION
2000.0000 [IU] | Freq: Once | INTRAVENOUS | Status: AC
  Administered 2011-09-09: 2000 [IU] via INTRAVENOUS
  Filled 2011-09-09: qty 2000

## 2011-09-09 MED ORDER — SODIUM CHLORIDE 0.9 % IJ SOLN
3.0000 mL | Freq: Two times a day (BID) | INTRAMUSCULAR | Status: DC
  Administered 2011-09-09 (×2): 3 mL via INTRAVENOUS

## 2011-09-09 MED ORDER — FENTANYL CITRATE 0.05 MG/ML IJ SOLN
INTRAMUSCULAR | Status: DC | PRN
  Administered 2011-09-09 (×2): 25 ug via INTRAVENOUS

## 2011-09-09 MED ORDER — BUTAMBEN-TETRACAINE-BENZOCAINE 2-2-14 % EX AERO
INHALATION_SPRAY | CUTANEOUS | Status: DC | PRN
  Administered 2011-09-09: 2 via TOPICAL

## 2011-09-09 MED ORDER — METOPROLOL TARTRATE 25 MG PO TABS
25.0000 mg | ORAL_TABLET | Freq: Two times a day (BID) | ORAL | Status: DC
  Administered 2011-09-09 – 2011-09-10 (×2): 25 mg via ORAL
  Filled 2011-09-09 (×3): qty 1

## 2011-09-09 MED ORDER — SODIUM CHLORIDE 0.9 % IV SOLN
INTRAVENOUS | Status: DC | PRN
  Administered 2011-09-09: 14:00:00 via INTRAVENOUS

## 2011-09-09 MED ORDER — FENTANYL CITRATE 0.05 MG/ML IJ SOLN
INTRAMUSCULAR | Status: AC
  Filled 2011-09-09: qty 2

## 2011-09-09 MED ORDER — MIDAZOLAM HCL 10 MG/2ML IJ SOLN
INTRAMUSCULAR | Status: DC | PRN
  Administered 2011-09-09 (×2): 2 mg via INTRAVENOUS
  Administered 2011-09-09: 1 mg via INTRAVENOUS

## 2011-09-09 MED ORDER — HEPARIN (PORCINE) IN NACL 100-0.45 UNIT/ML-% IJ SOLN
1450.0000 [IU]/h | INTRAMUSCULAR | Status: DC
  Administered 2011-09-09: 1450 [IU]/h via INTRAVENOUS
  Filled 2011-09-09 (×2): qty 250

## 2011-09-09 MED ORDER — MIDAZOLAM HCL 10 MG/2ML IJ SOLN
INTRAMUSCULAR | Status: AC
  Filled 2011-09-09: qty 2

## 2011-09-09 NOTE — Progress Notes (Signed)
Pt 's HR remains stubbornly in 130's at rest post titrating cardizem to max dose (15mg ), on call MD (L. Kilroy) notified. New order received. Will continue to monitor.

## 2011-09-09 NOTE — Anesthesia Postprocedure Evaluation (Signed)
  Anesthesia Post-op Note  Patient: [name] [name]  Procedure(s) Performed: Procedure(s) (LRB): TRANSESOPHAGEAL ECHOCARDIOGRAM (TEE) (N/A)  Patient Location: Endoscopy  Anesthesia Type: General  Level of Consciousness: awake, alert  and oriented  Airway and Oxygen Therapy: Patient Spontanous Breathing and Patient connected to nasal cannula oxygen  Post-op Pain: none  Post-op Assessment: Post-op Vital signs reviewed and Patient's Cardiovascular Status Stable  Post-op Vital Signs: stable  Complications: No apparent anesthesia complications

## 2011-09-09 NOTE — Progress Notes (Signed)
ANTICOAGULATION CONSULT NOTE   Pharmacy Consult for heparin Indication: atrial fibrillation  Allergies  Allergen Reactions  . Wellbutrin (Bupropion) Anaphylaxis    Patient Measurements: Height: 5\' 7"  (170.2 cm) Weight: 210 lb 15.7 oz (95.7 kg) IBW/kg (Calculated) : 61.6  Heparin Dosing Weight: 83.2 kg  Vital Signs: Temp: 98.2 F (36.8 C) (05/07 2345) Temp src: Oral (05/07 2345) BP: 97/51 mmHg (05/08 0000) Pulse Rate: 141  (05/07 2131)  Labs:  Basename 09/09/11 0001 09/08/11 1615 09/08/11 1614 09/08/11 1345 09/07/11 0907  HGB -- -- -- 15.1* 14.6  HCT -- -- -- 43.9 42.9  PLT -- -- -- 306 289  APTT -- -- 35 -- --  LABPROT -- -- 12.9 -- --  INR -- -- 0.95 -- --  HEPARINUNFRC 0.26* -- -- -- --  CREATININE -- -- -- 0.81 0.81  CKTOTAL 52 56 -- -- 99  CKMB 2.0 2.3 -- -- 2.8  TROPONINI <0.30 <0.30 -- -- <0.30    Estimated Creatinine Clearance: 97.5 ml/min (by C-G formula based on Cr of 0.81).  Assessment: 51 yo female with afib for heparin.  Goal of Therapy:  Heparin level 0.3 - 0.7    Plan:  Heparin 2000 units IV bolus, then increase heparin 1600 units/hr Follow-up am labs.  Cyree Chuong, Gary Fleet 09/09/2011,12:55 AM

## 2011-09-09 NOTE — OR Nursing (Signed)
1408 Anesthesia in to sedate for cardioversion.

## 2011-09-09 NOTE — Transfer of Care (Signed)
Immediate Anesthesia Transfer of Care Note  Patient: [name] [name]  Procedure(s) Performed: Procedure(s) (LRB): TRANSESOPHAGEAL ECHOCARDIOGRAM (TEE) (N/A)  Patient Location: PACU and Endoscopy Unit  Anesthesia Type: MAC  Level of Consciousness: awake, alert  and oriented  Airway & Oxygen Therapy: Patient Spontanous Breathing and Patient connected to nasal cannula oxygen  Post-op Assessment: Report given to PACU RN and Post -op Vital signs reviewed and stable  Post vital signs: Reviewed and stable  Complications: No apparent anesthesia complications

## 2011-09-09 NOTE — CV Procedure (Signed)
[name] [name],[name] [name] Female, 51 y.o., [birth year]  MRN: [id number]  Procedure: Electrical Cardioversion Indications:  Atrial Flutter  Procedure Details:  Consent: Risks of procedure as well as the alternatives and risks of each were explained to the (patient/caregiver).  Consent for procedure obtained.  Time Out: Verified patient identification, verified procedure, site/side was marked, verified correct patient position, special equipment/implants available, medications/allergies/relevent history reviewed, required imaging and test results available.  Performed  Patient placed on cardiac monitor, pulse oximetry, supplemental oxygen as necessary.  Sedation given: Benzodiazepines and opiates for TEE, Propofol IV courtesy of Dr. Janean Sark, Anesthesiology. Pacer pads placed anterior and posterior chest.  Cardioverted 1 time(s).  Cardioverted at 75J. Synchronized biphasic.  Evaluation: Findings: Post procedure EKG shows: NSR 60 bpm. Complications: None Patient did tolerate procedure well.  Time Spent Directly with the Patient:  30 minutes   [name] [name] 09/09/2011, 2:49 PM

## 2011-09-09 NOTE — Progress Notes (Signed)
*  PRELIMINARY RESULTS* Echocardiogram Echocardiogram Transesophageal has been performed.  Glean Salen [facility] 09/09/2011, 3:03 PM

## 2011-09-09 NOTE — ED Provider Notes (Signed)
Medical screening examination/treatment/procedure(s) were performed by non-physician practitioner and as supervising physician I was immediately available for consultation/collaboration.    Nelia Shi, MD 09/09/11 0730

## 2011-09-09 NOTE — Anesthesia Postprocedure Evaluation (Signed)
  Anesthesia Post-op Note  Patient: [name] [name]  Procedure(s) Performed: Procedure(s) (LRB): TRANSESOPHAGEAL ECHOCARDIOGRAM (TEE) (N/A)  Patient Location: PACU and Endoscopy Unit  Anesthesia Type: MAC  Level of Consciousness: awake, alert  and oriented  Airway and Oxygen Therapy: Patient Spontanous Breathing and Patient connected to nasal cannula oxygen  Post-op Pain: none  Post-op Assessment: Post-op Vital signs reviewed, Patient's Cardiovascular Status Stable, Respiratory Function Stable, Patent Airway and No signs of Nausea or vomiting  Post-op Vital Signs: Reviewed and stable  Complications: No apparent anesthesia complications

## 2011-09-09 NOTE — CV Procedure (Signed)
[name],[name] Female, 51 y.o., 1960/08/13  MRN: [id number]  INDICATIONS: Atrial flutter  PROCEDURE:   Informed consent was obtained prior to the procedure. The risks, benefits and alternatives for the procedure were discussed and the patient comprehended these risks.  Risks include, but are not limited to, cough, sore throat, vomiting, nausea, somnolence, esophageal and stomach trauma or perforation, bleeding, low blood pressure, aspiration, pneumonia, infection, trauma to the teeth and death.    After a procedural time-out, the oropharynx was anesthetized with 20% benzocaine spray. The patient was given 5 mg versed and 50 mcg fentanyl for moderate sedation.   The transesophageal probe was inserted in the esophagus and stomach without difficulty and multiple views were obtained.  The patient was kept under observation until the patient left the procedure room.  The patient left the procedure room in stable condition.   Agitated microbubble saline contrast was administered.  COMPLICATIONS:    There were no immediate complications.  FINDINGS:  No LA thrombus. Normal TEE.  RECOMMENDATIONS:    Proceed with cardioversion. Low risk for embolic events (CHADS2 score = 0). Xarelto for 30 days, then use Aspirin 150mg  daily for prophylaxis of embolism.  Time Spent Directly with the Patient:  30 minutes   Jeannene Tschetter 09/09/2011, 2:47 PM

## 2011-09-09 NOTE — Anesthesia Preprocedure Evaluation (Addendum)
Anesthesia Evaluation  Patient identified by MRN, date of birth, ID band Patient awake    Reviewed: Allergy & Precautions, H&P , NPO status , Patient's Chart, lab work & pertinent test results  Airway Mallampati: I TM Distance: >3 FB Neck ROM: full    Dental  (+) Dental Advidsory Given and Teeth Intact   Pulmonary  breath sounds clear to auscultation        Cardiovascular + dysrhythmias Atrial Fibrillation Rhythm:Irregular Rate:Tachycardia     Neuro/Psych    GI/Hepatic   Endo/Other    Renal/GU      Musculoskeletal   Abdominal   Peds  Hematology   Anesthesia Other Findings   Reproductive/Obstetrics                          Anesthesia Physical Anesthesia Plan  ASA: III  Anesthesia Plan: General   Post-op Pain Management:    Induction: Intravenous  Airway Management Planned: Mask  Additional Equipment:   Intra-op Plan:   Post-operative Plan:   Informed Consent: I have reviewed the patients History and Physical, chart, labs and discussed the procedure including the risks, benefits and alternatives for the proposed anesthesia with the patient or authorized representative who has indicated his/her understanding and acceptance.   Dental Advisory Given and Dental advisory given  Plan Discussed with: Anesthesiologist, CRNA and Surgeon  Anesthesia Plan Comments:        Anesthesia Quick Evaluation

## 2011-09-09 NOTE — Progress Notes (Signed)
ANTICOAGULATION CONSULT NOTE - Follow Up Consult  Pharmacy Consult for Heparin Indication: atrial fibrillation  Allergies  Allergen Reactions  . Wellbutrin (Bupropion) Anaphylaxis    Patient Measurements: Heparin Dosing Weight: 83.2kg  Vital Signs: Temp: 98.6 F (37 C) (05/08 0700) Temp src: Oral (05/08 0700) BP: 98/71 mmHg (05/08 1000) Pulse Rate: 102  (05/08 0402)  Labs:  Basename 09/09/11 0850 09/09/11 0001 09/08/11 1615 09/08/11 1614 09/08/11 1345 09/07/11 0907  HGB 14.9 -- -- -- 15.1* --  HCT 43.5 -- -- -- 43.9 42.9  PLT 322 -- -- -- 306 289  APTT -- -- -- 35 -- --  LABPROT -- -- -- 12.9 -- --  INR -- -- -- 0.95 -- --  HEPARINUNFRC 0.71* 0.26* -- -- -- --  CREATININE -- -- -- -- 0.81 0.81  CKTOTAL -- 52 56 -- -- 99  CKMB -- 2.0 2.3 -- -- 2.8  TROPONINI -- <0.30 <0.30 -- -- <0.30    Estimated Creatinine Clearance: 97.7 ml/min (by C-G formula based on Cr of 0.81).  Medications:  Heparin @ 1600 units/hr  Assessment: 50yof continues on heparin for new onset afib/flutter. Heparin level is at the high end of therapeutic after re-bolus and rate increase this morning. No bleeding reported per patient. CBC stable. Will decrease rate slightly. Remains in aflutter. Plan for TEE/DCCV.  Goal of Therapy:  Heparin level 0.3-0.7 units/ml Monitor platelets by anticoagulation protocol: Yes   Plan:  1) Decrease heparin to 1450 units/hr 2) Follow up heparin level, CBC in AM  Fredrik Rigger 09/09/2011,10:17 AM

## 2011-09-09 NOTE — Progress Notes (Signed)
Subjective:  No complaints, she can tell she is in AF.  Objective:  Vital Signs in the last 24 hours: Temp:  [96.8 F (36 C)-98.6 F (37 C)] 98.6 F (37 C) (05/08 0700) Pulse Rate:  [101-141] 102  (05/08 0402) Resp:  [16-26] 18  (05/08 0800) BP: (97-125)/(41-91) 97/66 mmHg (05/08 0900) SpO2:  [94 %-100 %] 95 % (05/08 0800) Weight:  [95.7 kg (210 lb 15.7 oz)-97.523 kg (215 lb)] 95.8 kg (211 lb 3.2 oz) (05/08 0441)  Intake/Output from previous day:  Intake/Output Summary (Last 24 hours) at 09/09/11 0910 Last data filed at 09/09/11 0900  Gross per 24 hour  Intake 1078.54 ml  Output   1950 ml  Net -871.46 ml    Physical Exam: General appearance: alert, cooperative, no distress and mildly obese Lungs: clear to auscultation bilaterally Heart: irregularly irregular rhythm   Rate: 75-140  Rhythm: atrial flutter  Lab Results:  Basename 09/08/11 1345 09/07/11 0907  WBC 6.0 7.4  HGB 15.1* 14.6  PLT 306 289    Basename 09/08/11 1345 09/07/11 0907  NA 139 141  K 3.9 4.1  CL 103 105  CO2 26 25  GLUCOSE 97 108*  BUN 13 15  CREATININE 0.81 0.81    Basename 09/09/11 0001 09/08/11 1615  TROPONINI <0.30 <0.30   Hepatic Function Panel  Basename 09/08/11 1614  PROT 6.8  ALBUMIN 4.0  AST 16  ALT 20  ALKPHOS 60  BILITOT 0.2*  BILIDIR <0.1  IBILI NOT CALCULATED   No results found for this basename: CHOL in the last 72 hours  Basename 09/08/11 1614  INR 0.95    Imaging: Imaging results have been reviewed  Cardiac Studies:  Assessment/Plan:   Principal Problem:  *Atrial flutter with rapid ventricular response Active Problems:  Family history of acute myocardial infarction  Hyperlipemia  Depression  Plan- HR better with the addition on Lanoxin but rate is still labile and jumps up quickly. She is NPO but I don't think we were able to schedule TEE/CV.    Corine Shelter PA-C 09/09/2011, 9:10 AM   I have seen and examined the patient along with Corine Shelter  PA-C,PA.  I have reviewed the chart, notes and new data.  I agree with PA's note.  Key new complaints: palpitations Key examination changes: AFlutter w 2:1 AV block Key new findings / data: K 4.3 Mg 2.1, creat 0.83; Hard to measure QT due to flutter, but QTc estimated at around 480 ms so Corvert is probably not a good option  PLAN: TEE risks and benefits reviewed and she agrees to proceed. If we cannot find Anesthesiology support today, may have to delay DC CV til tomorrow.  Thurmon Fair, MD, [facility] [phone] 09/09/2011, 11:02 AM

## 2011-09-09 NOTE — Care Management Note (Addendum)
    Page 1 of 1   09/10/2011     10:34:21 AM   CARE MANAGEMENT NOTE 09/10/2011  Patient:  [name] [name], [name] [name]   Account Number:  [id number]  Date Initiated:  09/09/2011  Documentation initiated by:  Junius Creamer  Subjective/Objective Assessment:   adm w at flutter     Action/Plan:   lives w husband, pcp dr Lanora Manis dewey   Anticipated DC Date:  09/12/2011   Anticipated DC Plan:  HOME/SELF CARE      DC Planning Services  CM consult  Medication Assistance      Choice offered to / List presented to:             Status of service:   Medicare Important Message given?   (If response is "NO", the following Medicare IM given date fields will be blank) Date Medicare IM given:   Date Additional Medicare IM given:    Discharge Disposition:  HOME/SELF CARE  Per UR Regulation:  Reviewed for med. necessity/level of care/duration of stay  If discussed at Long Length of Stay Meetings, dates discussed:    Comments:  5/9 10:32a debbied owell rn,bsn spoke w pt and gave her xarelto card for 5 days free. her ins does not cover and she has to pay [phone] for 30day supply. pt states md stated his office may have some samples. she does have card for 5 days free. ' 5/8 12:00n debbie Shaughnessy Gethers rn,bsn [phone]

## 2011-09-10 ENCOUNTER — Encounter (HOSPITAL_COMMUNITY): Payer: Self-pay | Admitting: Cardiovascular Disease

## 2011-09-10 LAB — CBC
HCT: 39.5 % (ref 36.0–46.0)
Hemoglobin: 13.2 g/dL (ref 12.0–15.0)
MCH: 30.4 pg (ref 26.0–34.0)
MCHC: 33.4 g/dL (ref 30.0–36.0)
RBC: 4.34 MIL/uL (ref 3.87–5.11)

## 2011-09-10 MED ORDER — ASPIRIN 81 MG PO TBEC: 81.0000 mg | DELAYED_RELEASE_TABLET | Freq: Every day | ORAL | Status: DC

## 2011-09-10 MED ORDER — METOPROLOL TARTRATE 25 MG PO TABS: 25.0000 mg | ORAL_TABLET | Freq: Two times a day (BID) | ORAL | Status: DC

## 2011-09-10 MED ORDER — RIVAROXABAN 10 MG PO TABS
20.0000 mg | ORAL_TABLET | Freq: Every day | ORAL | Status: DC
  Filled 2011-09-10: qty 2

## 2011-09-10 MED ORDER — ROSUVASTATIN CALCIUM 5 MG PO TABS: 5.0000 mg | ORAL_TABLET | ORAL | Status: DC

## 2011-09-10 MED ORDER — RIVAROXABAN 20 MG PO TABS: 20.0000 mg | ORAL_TABLET | Freq: Every day | ORAL | Status: DC

## 2011-09-10 NOTE — Discharge Instructions (Signed)
Arrhythmia Categories Your heart is a muscle that works to pump blood through your body by regular contractions. The beating of your heart is controlled by a system of special pacemaker cells. These cells control the electrical activity of the heart. When the system controlling this regular beating is disturbed, a heart rhythm abnormality (arrhythmia) results. WHEN YOUR HEART SKIPS A BEAT One of the most common and least serious heart arrhythmias is called an ectopic or premature atrial heartbeat (PAC). This may be noticed as a small change in your regular pulse. A PAC originates from the top part (atrium) of the heart. Within the right atrium, the SA node is the area that normally controls the regularity of the heart. PACs occur in heart tissue outside of the SA node region. You may feel this as a skipped beat or heart flutter, especially if several occur in succession or occur frequently.  Another arrhythmia is ventricular premature complex (VCP or PVC). These extra beats start out in the bottom, more muscular chambers of the heart. In most cases a PVC is harmless. If there are underlying causes that are making the heart irritable such as an overactive thyroid or a prior heart attack PVCs may be of more concern. In a few cases, medications to control the heart rhythm may be prescribed. Things to try at home:  Cut down or avoid alcohol, tobacco and caffeine.   Get enough sleep.   Reduce stress.   Exercise more.  WHEN THE HEART BEATS TOO FAST Atrial tachycardia is a fast heart rate, which starts out in the atrium. It may last from minutes to much longer. Your heart may beat 140 to 240 times per minute instead of the normal 60 to 100.  Symptoms include a worried feeling (anxiety) and a sense that your heart is beating fast and hard.   You may be able to stop the fast rate by holding your breath or bearing down as if you were going to have a bowel movement.   This type of fast rate is usually not  dangerous.  Atrial fibrillation and atrial flutter are other fast rhythms that start in the atria. Both conditions keep the atria from filling with enough blood so the heart does not work well.  Symptoms include feeling lightheaded or faint.   These fast rates may be the result of heart damage or disease. Too much thyroid hormone may play a role.   There may be no clear cause or it may be from heart disease or damage.   Medication or a special electrical treatment (cardioversion) may be needed to get the heart beating normally.  Ventricular tachycardia is a fast heart rate that starts in the lower muscular chambers (ventricles) This is a serious disorder that requires treatment as soon as possible. You need someone else to get and use a small defibrillator.  Symptoms include collapse, chest pain, or being short of breath.   Treatment may include medication, procedures to improve blood flow to the heart, or an implantable cardiac defibrillator (ICD).  DIAGNOSIS   A cardiogram (EKG or ECG) will be done to see the arrhythmia, as well as lab tests to check the underlying cause.   If the extra beats or fast rate come and go, you may wear a Holter monitor that records your heart rate for a longer period of time.  HOME CARE INSTRUCTIONS  If your caregiver has given you a follow-up appointment, it is very important to keep that appointment. Not keeping the   appointment could result in a heart attack, permanent injury, pain, and disability. If there is any problem keeping the appointment, you must call back to this facility for assistance.  SEEK MEDICAL CARE IF:  You have irregular or fast heartbeats (palpitations).   You experience skipped beats.   You develop lightheadedness.   You have chest discomfort.   You develop shortness of breath.   You have more frequent episodes, if you are already being treated.  SEEK IMMEDIATE MEDICAL CARE IF:   You have severe chest pain, especially if the  pain is crushing or pressure-like and spreads to the arms, back, neck, or jaw, or if you have sweating, feeling sick to your stomach (nausea), or shortness of breath. THIS IS AN EMERGENCY. Do not wait to see if the pain will go away. Get medical help at once. Call 911 or 0 (operator). DO NOT drive yourself to the hospital.   You feel dizzy or faint.   You have episodes of previously documented atrial tachycardia that do not resolve with the techniques your caregiver has taught you.   Irregular or rapid heartbeats begin to occur more often than in the past, especially if they are associated with more pronounced symptoms or of longer duration.  Document Released: 04/20/2005 Document Revised: 04/09/2011 Document Reviewed: 09/02/2006 [employer] Patient Information 2012 [employer]. 

## 2011-09-10 NOTE — Discharge Summary (Signed)
Patient ID: [name] [name],  MRN: [id number], DOB/AGE: February 06, 1961 51 y.o.  Admit date: 09/08/2011 Discharge date: 09/10/2011  Primary Care Provider: Dr Loreen Freud Primary Cardiologist: Dr Allyson Sabal  Discharge Diagnoses:  Principal Problem:  *Atrial flutter with rapid ventricular response, TEE/CV this admission  Active Problems:  Family history of acute myocardial infarction  Hyperlipemia, with a history of statin intol-myalgias  Depression    Procedures: TEE/CV 09/09/11   Hospital Course: 51 year old white female presents back to the emergency room 09/08/11 with a flutter with rapid ventricular response and associated dizziness. She was in the emergency room 09/07/11 after 24 hours of increased heart rate and not feeling well. She was in atrial flutter was given flecinide oral bolus by the ER physician and apparently converted to returned to sinus rhythm. At home she did have continued tachycardia she rested all day this morning until noon she was to see her primary care physician she stood up she was excessively dizzy and came to the emergency room. She is in atrial flutter with rapid ventricular response heart rate up to 150s at times. She was admitted to step down and started on Heparin, IV Diltiazem and low dose beta blocker. Lanoxin was added to help control her rate later that night. On 09/09/11 Dr Royann Shivers performed a TEE/CV and she converted to NSR. She is discharge 09/10/11 and will continue Xarelto for one month the change to ASA 162mg . She diod have an elevated LDL at 186. She reports a history of myalgias 8 yrs ago with Lipitor and Crestor. We had her try Crestor 5mg  3X week at discharge. TSH and Troponin NL.   Discharge Vitals:  Blood pressure 100/48, pulse 80, temperature 98.8 F (37.1 C), temperature source Oral, resp. rate 16, height 5\' 7"  (1.702 m), weight 95.3 kg (210 lb 1.6 oz), SpO2 96.00%.    Labs: Results for orders placed during the hospital encounter of 09/08/11 (from the past 48  hour(s))  CBC     Status: Abnormal   Collection Time   09/08/11  1:45 PM      Component Value Range Comment   WBC 6.0  4.0 - 10.5 (K/uL)    RBC 4.88  3.87 - 5.11 (MIL/uL)    Hemoglobin 15.1 (*) 12.0 - 15.0 (g/dL)    HCT 69.6  29.5 - 28.4 (%)    MCV 90.0  78.0 - 100.0 (fL)    MCH 30.9  26.0 - 34.0 (pg)    MCHC 34.4  30.0 - 36.0 (g/dL)    RDW 13.2  44.0 - 10.2 (%)    Platelets 306  150 - 400 (K/uL)   BASIC METABOLIC PANEL     Status: Abnormal   Collection Time   09/08/11  1:45 PM      Component Value Range Comment   Sodium 139  135 - 145 (mEq/L)    Potassium 3.9  3.5 - 5.1 (mEq/L)    Chloride 103  96 - 112 (mEq/L)    CO2 26  19 - 32 (mEq/L)    Glucose, Bld 97  70 - 99 (mg/dL)    BUN 13  6 - 23 (mg/dL)    Creatinine, Ser 7.25  0.50 - 1.10 (mg/dL)    Calcium 36.6  8.4 - 10.5 (mg/dL)    GFR calc non Af Amer 83 (*) >90 (mL/min)    GFR calc Af Amer >90  >90 (mL/min)   POCT I-STAT TROPONIN I     Status: Normal   Collection Time  09/08/11  2:10 PM      Component Value Range Comment   Troponin i, poc 0.00  0.00 - 0.08 (ng/mL)    Comment 3            HEPATIC FUNCTION PANEL     Status: Abnormal   Collection Time   09/08/11  4:14 PM      Component Value Range Comment   Total Protein 6.8  6.0 - 8.3 (g/dL)    Albumin 4.0  3.5 - 5.2 (g/dL)    AST 16  0 - 37 (U/L)    ALT 20  0 - 35 (U/L)    Alkaline Phosphatase 60  39 - 117 (U/L)    Total Bilirubin 0.2 (*) 0.3 - 1.2 (mg/dL)    Bilirubin, Direct <4.0  0.0 - 0.3 (mg/dL)    Indirect Bilirubin NOT CALCULATED  0.3 - 0.9 (mg/dL)   PROTIME-INR     Status: Normal   Collection Time   09/08/11  4:14 PM      Component Value Range Comment   Prothrombin Time 12.9  11.6 - 15.2 (seconds)    INR 0.95  0.00 - 1.49    APTT     Status: Normal   Collection Time   09/08/11  4:14 PM      Component Value Range Comment   aPTT 35  24 - 37 (seconds)   CARDIAC PANEL(CRET KIN+CKTOT+MB+TROPI)     Status: Normal   Collection Time   09/08/11  4:15 PM      Component  Value Range Comment   Total CK 56  7 - 177 (U/L)    CK, MB 2.3  0.3 - 4.0 (ng/mL)    Troponin I <0.30  <0.30 (ng/mL)    Relative Index RELATIVE INDEX IS INVALID  0.0 - 2.5    MRSA PCR SCREENING     Status: Normal   Collection Time   09/08/11  7:41 PM      Component Value Range Comment   MRSA by PCR NEGATIVE  NEGATIVE    CARDIAC PANEL(CRET KIN+CKTOT+MB+TROPI)     Status: Normal   Collection Time   09/09/11 12:01 AM      Component Value Range Comment   Total CK 52  7 - 177 (U/L)    CK, MB 2.0  0.3 - 4.0 (ng/mL)    Troponin I <0.30  <0.30 (ng/mL)    Relative Index RELATIVE INDEX IS INVALID  0.0 - 2.5    HEPARIN LEVEL (UNFRACTIONATED)     Status: Abnormal   Collection Time   09/09/11 12:01 AM      Component Value Range Comment   Heparin Unfractionated 0.26 (*) 0.30 - 0.70 (IU/mL)   CARDIAC PANEL(CRET KIN+CKTOT+MB+TROPI)     Status: Normal   Collection Time   09/09/11  8:50 AM      Component Value Range Comment   Total CK 51  7 - 177 (U/L)    CK, MB 1.9  0.3 - 4.0 (ng/mL)    Troponin I <0.30  <0.30 (ng/mL)    Relative Index RELATIVE INDEX IS INVALID  0.0 - 2.5    CBC     Status: Normal   Collection Time   09/09/11  8:50 AM      Component Value Range Comment   WBC 6.2  4.0 - 10.5 (K/uL)    RBC 4.79  3.87 - 5.11 (MIL/uL)    Hemoglobin 14.9  12.0 - 15.0 (g/dL)    HCT 43.5  36.0 - 46.0 (%)    MCV 90.8  78.0 - 100.0 (fL)    MCH 31.1  26.0 - 34.0 (pg)    MCHC 34.3  30.0 - 36.0 (g/dL)    RDW 40.9  81.1 - 91.4 (%)    Platelets 322  150 - 400 (K/uL)   BASIC METABOLIC PANEL     Status: Abnormal   Collection Time   09/09/11  8:50 AM      Component Value Range Comment   Sodium 142  135 - 145 (mEq/L)    Potassium 4.3  3.5 - 5.1 (mEq/L)    Chloride 104  96 - 112 (mEq/L)    CO2 27  19 - 32 (mEq/L)    Glucose, Bld 100 (*) 70 - 99 (mg/dL)    BUN 15  6 - 23 (mg/dL)    Creatinine, Ser 7.82  0.50 - 1.10 (mg/dL)    Calcium 95.6  8.4 - 10.5 (mg/dL)    GFR calc non Af Amer 80 (*) >90 (mL/min)    GFR  calc Af Amer >90  >90 (mL/min)   HEMOGLOBIN A1C     Status: Abnormal   Collection Time   09/09/11  8:50 AM      Component Value Range Comment   Hemoglobin A1C 6.2 (*) <5.7 (%)    Mean Plasma Glucose 131 (*) <117 (mg/dL)   HEPARIN LEVEL (UNFRACTIONATED)     Status: Abnormal   Collection Time   09/09/11  8:50 AM      Component Value Range Comment   Heparin Unfractionated 0.71 (*) 0.30 - 0.70 (IU/mL)   MAGNESIUM     Status: Normal   Collection Time   09/09/11  8:50 AM      Component Value Range Comment   Magnesium 2.1  1.5 - 2.5 (mg/dL)   LIPID PANEL     Status: Abnormal   Collection Time   09/09/11  8:50 AM      Component Value Range Comment   Cholesterol 280 (*) 0 - 200 (mg/dL)    Triglycerides 213 (*) <150 (mg/dL)    HDL 51  >08 (mg/dL)    Total CHOL/HDL Ratio 5.5      VLDL 43 (*) 0 - 40 (mg/dL)    LDL Cholesterol 657 (*) 0 - 99 (mg/dL)   CBC     Status: Normal   Collection Time   09/10/11  5:04 AM      Component Value Range Comment   WBC 5.2  4.0 - 10.5 (K/uL)    RBC 4.34  3.87 - 5.11 (MIL/uL)    Hemoglobin 13.2  12.0 - 15.0 (g/dL)    HCT 84.6  96.2 - 95.2 (%)    MCV 91.0  78.0 - 100.0 (fL)    MCH 30.4  26.0 - 34.0 (pg)    MCHC 33.4  30.0 - 36.0 (g/dL)    RDW 84.1  32.4 - 40.1 (%)    Platelets 267  150 - 400 (K/uL)     Disposition:    Discharge Medications:  Medication List  As of 09/10/2011  9:19 AM   TAKE these medications         aspirin 81 MG EC tablet   Take 1 tablet (81 mg total) by mouth daily.      fenofibrate 160 MG tablet   Take 160 mg by mouth at bedtime.      metoprolol tartrate 25 MG tablet   Commonly known as: LOPRESSOR   Take  1 tablet (25 mg total) by mouth 2 (two) times daily.      Rivaroxaban 20 MG Tabs   Take 20 mg by mouth daily with supper.      rosuvastatin 5 MG tablet   Commonly known as: CRESTOR   Take 1 tablet (5 mg total) by mouth 3 (three) times a week.      venlafaxine XR 150 MG 24 hr capsule   Commonly known as: EFFEXOR-XR   Take  150 mg by mouth 2 (two) times daily.            Outstanding Labs/Studiesnone  Duration of Discharge Encounter: Greater than 30 minutes including physician time.  Signed, Corine Shelter PA-C 09/10/2011 9:19 AM.  Discussed likely recurrent nature of A flutter. Plan Xarelto for 1 month, but long term ASA should be sufficient for CVA prevention as CHADS score is zero. Antiarrhythmics not justified for a single event, but will stay on AV node blocking agents at least short term. If early/frequent A flutter recurrence recommend early referral to EPS/RF ablation.   Thurmon Fair, MD, [facility] [phone] office [phone] pager 09/10/2011 9:43 AM

## 2011-09-10 NOTE — Progress Notes (Signed)
Subjective:  No complaints  Objective:  Vital Signs in the last 24 hours: Temp:  [96.2 F (35.7 C)-98.8 F (37.1 C)] 98.8 F (37.1 C) (05/09 0802) Pulse Rate:  [70-85] 80  (05/08 2357) Resp:  [13-88] 16  (05/09 0802) BP: (96-117)/(40-79) 100/48 mmHg (05/09 0259) SpO2:  [93 %-97 %] 96 % (05/09 0802) Weight:  [95.3 kg (210 lb 1.6 oz)] 95.3 kg (210 lb 1.6 oz) (05/09 0412)  Intake/Output from previous day:  Intake/Output Summary (Last 24 hours) at 09/10/11 0900 Last data filed at 09/09/11 2200  Gross per 24 hour  Intake  625.5 ml  Output   1500 ml  Net -874.5 ml    Physical Exam: General appearance: alert, cooperative, no distress and mildly obese Lungs: clear to auscultation bilaterally Heart: regular rate and rhythm   Rate: 78  Rhythm: normal sinus rhythm  Lab Results:  Basename 09/10/11 0504 09/09/11 0850  WBC 5.2 6.2  HGB 13.2 14.9  PLT 267 322    Basename 09/09/11 0850 09/08/11 1345  NA 142 139  K 4.3 3.9  CL 104 103  CO2 27 26  GLUCOSE 100* 97  BUN 15 13  CREATININE 0.83 0.81    Basename 09/09/11 0850 09/09/11 0001  TROPONINI <0.30 <0.30   Hepatic Function Panel  Basename 09/08/11 1614  PROT 6.8  ALBUMIN 4.0  AST 16  ALT 20  ALKPHOS 60  BILITOT 0.2*  BILIDIR <0.1  IBILI NOT CALCULATED    Basename 09/09/11 0850  CHOL 280*    Basename 09/08/11 1614  INR 0.95    Imaging: Imaging results have been reviewed  Cardiac Studies:  Assessment/Plan:   Principal Problem:  *Atrial flutter with rapid ventricular response Active Problems:  Family history of acute myocardial infarction  Hyperlipemia  Depression  Plan- NSR after TEE/CV. Home on Xarelto X 30days then ASA 162mg . PA f/u 2wks, then Dr Allyson Sabal 2 months. Will add statin 3 X wk, (LDL 186). No history C/W OSA.    Corine Shelter PA-C 09/10/2011, 9:00 AM  I have seen and examined the patient along with Corine Shelter PA-C.  I have reviewed the chart, notes and new data.  I agree with PA's  note.  PLAN: Discussed likely recurrent nature of A flutter. Plan Xarelto for 1 month, but long term ASA should be sufficient for CVA prevention as CHADS score is zero. Antiarrhythmics not justified for a single event, but will stay on AV node blocking agents at least short term. If early/frequent A flutter recurrence recommend early referral to EPS/RF ablation.  Statin for significant hypercholesterolemia.  Thurmon Fair, MD, [facility] [phone] 09/10/2011, 9:40 AM

## 2011-09-29 ENCOUNTER — Other Ambulatory Visit: Payer: Self-pay | Admitting: Family Medicine

## 2011-09-29 DIAGNOSIS — Z8679 Personal history of other diseases of the circulatory system: Secondary | ICD-10-CM | POA: Insufficient documentation

## 2011-09-29 DIAGNOSIS — Z780 Asymptomatic menopausal state: Secondary | ICD-10-CM

## 2011-10-20 ENCOUNTER — Ambulatory Visit
Admission: RE | Admit: 2011-10-20 | Discharge: 2011-10-20 | Disposition: A | Discharge status: Home / Self Care | Payer: BC Managed Care – PPO | Source: Ambulatory Visit | Attending: Family Medicine | Admitting: Family Medicine

## 2011-10-20 DIAGNOSIS — Z780 Asymptomatic menopausal state: Secondary | ICD-10-CM

## 2011-10-20 DIAGNOSIS — Z1231 Encounter for screening mammogram for malignant neoplasm of breast: Secondary | ICD-10-CM

## 2011-12-02 ENCOUNTER — Encounter (HOSPITAL_COMMUNITY): Payer: Self-pay | Admitting: *Deleted

## 2011-12-02 ENCOUNTER — Inpatient Hospital Stay (HOSPITAL_COMMUNITY)
Admission: EM | Admit: 2011-12-02 | Discharge: 2011-12-04 | DRG: 139 | Disposition: A | Discharge status: Home / Self Care | Payer: BC Managed Care – PPO | Attending: Cardiovascular Disease | Admitting: Cardiovascular Disease

## 2011-12-02 DIAGNOSIS — Z8249 Family history of ischemic heart disease and other diseases of the circulatory system: Secondary | ICD-10-CM

## 2011-12-02 DIAGNOSIS — Z7901 Long term (current) use of anticoagulants: Secondary | ICD-10-CM

## 2011-12-02 DIAGNOSIS — F3289 Other specified depressive episodes: Secondary | ICD-10-CM | POA: Diagnosis present

## 2011-12-02 DIAGNOSIS — I498 Other specified cardiac arrhythmias: Secondary | ICD-10-CM | POA: Diagnosis present

## 2011-12-02 DIAGNOSIS — F339 Major depressive disorder, recurrent, unspecified: Secondary | ICD-10-CM | POA: Diagnosis present

## 2011-12-02 DIAGNOSIS — I4892 Unspecified atrial flutter: Principal | ICD-10-CM | POA: Diagnosis present

## 2011-12-02 DIAGNOSIS — E785 Hyperlipidemia, unspecified: Secondary | ICD-10-CM | POA: Diagnosis present

## 2011-12-02 DIAGNOSIS — F329 Major depressive disorder, single episode, unspecified: Secondary | ICD-10-CM | POA: Diagnosis present

## 2011-12-02 DIAGNOSIS — Z87891 Personal history of nicotine dependence: Secondary | ICD-10-CM

## 2011-12-02 DIAGNOSIS — Z79899 Other long term (current) drug therapy: Secondary | ICD-10-CM

## 2011-12-02 DIAGNOSIS — E7800 Pure hypercholesterolemia, unspecified: Secondary | ICD-10-CM | POA: Diagnosis present

## 2011-12-02 HISTORY — DX: Serous retinal detachment, unspecified eye: H33.20

## 2011-12-02 LAB — BASIC METABOLIC PANEL
BUN: 27 mg/dL — ABNORMAL HIGH (ref 6–23)
CO2: 28 mEq/L (ref 19–32)
Calcium: 10.4 mg/dL (ref 8.4–10.5)
Creatinine, Ser: 0.89 mg/dL (ref 0.50–1.10)

## 2011-12-02 LAB — URINE MICROSCOPIC-ADD ON

## 2011-12-02 LAB — COMPREHENSIVE METABOLIC PANEL
ALT: 21 U/L (ref 0–35)
AST: 19 U/L (ref 0–37)
Albumin: 4.2 g/dL (ref 3.5–5.2)
Alkaline Phosphatase: 61 U/L (ref 39–117)
BUN: 27 mg/dL — ABNORMAL HIGH (ref 6–23)
CO2: 28 mEq/L (ref 19–32)
Calcium: 10.3 mg/dL (ref 8.4–10.5)
Chloride: 101 mEq/L (ref 96–112)
Creatinine, Ser: 0.86 mg/dL (ref 0.50–1.10)
GFR calc Af Amer: 88 mL/min — ABNORMAL LOW (ref >90)
GFR calc non Af Amer: 76 mL/min — ABNORMAL LOW (ref >90)
Glucose, Bld: 117 mg/dL — ABNORMAL HIGH (ref 70–99)
Potassium: 3.8 mEq/L (ref 3.5–5.1)
Sodium: 141 mEq/L (ref 135–145)
Total Bilirubin: 0.2 mg/dL — ABNORMAL LOW (ref 0.3–1.2)
Total Protein: 7.3 g/dL (ref 6.0–8.3)

## 2011-12-02 LAB — URINALYSIS, ROUTINE W REFLEX MICROSCOPIC
Bilirubin Urine: NEGATIVE
Glucose, UA: NEGATIVE mg/dL
Hgb urine dipstick: NEGATIVE
Ketones, ur: NEGATIVE mg/dL
Nitrite: NEGATIVE
Protein, ur: NEGATIVE mg/dL
Specific Gravity, Urine: 1.029 (ref 1.005–1.030)
Urobilinogen, UA: 1 mg/dL (ref 0.0–1.0)
pH: 5.5 (ref 5.0–8.0)

## 2011-12-02 LAB — CBC
HCT: 42.8 % (ref 36.0–46.0)
MCH: 30.4 pg (ref 26.0–34.0)
MCV: 92.2 fL (ref 78.0–100.0)
Platelets: 293 10*3/uL (ref 150–400)
RBC: 4.64 MIL/uL (ref 3.87–5.11)
RDW: 13.1 % (ref 11.5–15.5)

## 2011-12-02 LAB — PROTIME-INR
INR: 0.91 (ref 0.00–1.49)
Prothrombin Time: 12.5 seconds (ref 11.6–15.2)

## 2011-12-02 LAB — MRSA PCR SCREENING: MRSA by PCR: NEGATIVE

## 2011-12-02 LAB — MAGNESIUM: Magnesium: 2.3 mg/dL (ref 1.5–2.5)

## 2011-12-02 MED ORDER — DILTIAZEM HCL 25 MG/5ML IV SOLN
20.0000 mg | Freq: Once | INTRAVENOUS | Status: AC
  Administered 2011-12-02: 20 mg via INTRAVENOUS
  Filled 2011-12-02: qty 5

## 2011-12-02 MED ORDER — HEPARIN (PORCINE) IN NACL 100-0.45 UNIT/ML-% IJ SOLN
1550.0000 [IU]/h | INTRAMUSCULAR | Status: DC
  Administered 2011-12-02 – 2011-12-03 (×2): 1400 [IU]/h via INTRAVENOUS
  Administered 2011-12-04: 1550 [IU]/h via INTRAVENOUS
  Filled 2011-12-02 (×5): qty 250

## 2011-12-02 MED ORDER — HEPARIN BOLUS VIA INFUSION
4000.0000 [IU] | Freq: Once | INTRAVENOUS | Status: AC
  Administered 2011-12-02: 4000 [IU] via INTRAVENOUS

## 2011-12-02 MED ORDER — FENOFIBRATE 160 MG PO TABS
160.0000 mg | ORAL_TABLET | Freq: Every day | ORAL | Status: DC
  Administered 2011-12-02 – 2011-12-03 (×2): 160 mg via ORAL
  Filled 2011-12-02 (×3): qty 1

## 2011-12-02 MED ORDER — METOPROLOL TARTRATE 25 MG PO TABS
25.0000 mg | ORAL_TABLET | Freq: Two times a day (BID) | ORAL | Status: DC
  Administered 2011-12-04: 25 mg via ORAL
  Filled 2011-12-02 (×5): qty 1

## 2011-12-02 MED ORDER — DILTIAZEM HCL 25 MG/5ML IV SOLN
30.0000 mg | Freq: Once | INTRAVENOUS | Status: AC
  Administered 2011-12-02: 30 mg via INTRAVENOUS

## 2011-12-02 MED ORDER — DEXTROSE 5 % IV SOLN
5.0000 mg/h | Freq: Once | INTRAVENOUS | Status: AC
  Administered 2011-12-02: 15 mg/h via INTRAVENOUS
  Filled 2011-12-02: qty 100

## 2011-12-02 MED ORDER — ACETAMINOPHEN 325 MG PO TABS: 650.0000 mg | ORAL_TABLET | ORAL | Status: DC | PRN

## 2011-12-02 MED ORDER — ALPRAZOLAM 0.25 MG PO TABS: 0.2500 mg | ORAL_TABLET | Freq: Three times a day (TID) | ORAL | Status: DC | PRN

## 2011-12-02 MED ORDER — ASPIRIN 81 MG PO TBEC: 162.0000 mg | DELAYED_RELEASE_TABLET | Freq: Every day | ORAL | Status: DC

## 2011-12-02 MED ORDER — PREDNISOLONE ACETATE 1 % OP SUSP
1.0000 [drp] | Freq: Every day | OPHTHALMIC | Status: DC
  Administered 2011-12-02 – 2011-12-04 (×3): 1 [drp] via OPHTHALMIC
  Filled 2011-12-02: qty 1

## 2011-12-02 MED ORDER — ZOLPIDEM TARTRATE 5 MG PO TABS: 10.0000 mg | ORAL_TABLET | Freq: Every evening | ORAL | Status: DC | PRN

## 2011-12-02 MED ORDER — ATORVASTATIN CALCIUM 10 MG PO TABS
10.0000 mg | ORAL_TABLET | Freq: Every day | ORAL | Status: DC
  Filled 2011-12-02: qty 1

## 2011-12-02 MED ORDER — ASPIRIN EC 81 MG PO TBEC
162.0000 mg | DELAYED_RELEASE_TABLET | Freq: Every day | ORAL | Status: DC
  Administered 2011-12-03 – 2011-12-04 (×2): 162 mg via ORAL
  Filled 2011-12-02 (×2): qty 2

## 2011-12-02 MED ORDER — DILTIAZEM HCL 100 MG IV SOLR
5.0000 mg/h | Freq: Once | INTRAVENOUS | Status: AC
  Administered 2011-12-02: 10 mg/h via INTRAVENOUS
  Administered 2011-12-02: 15 mg/h via INTRAVENOUS
  Filled 2011-12-02: qty 100

## 2011-12-02 MED ORDER — VENLAFAXINE HCL ER 150 MG PO CP24
150.0000 mg | ORAL_CAPSULE | Freq: Two times a day (BID) | ORAL | Status: DC
  Administered 2011-12-02 – 2011-12-04 (×4): 150 mg via ORAL
  Filled 2011-12-02 (×5): qty 1

## 2011-12-02 NOTE — H&P (Signed)
. Patient ID: [name] [name] MRN: [id number], DOB/AGE: 1961/01/21   Admit date: 12/02/2011   Primary Physician: Maryelizabeth Rowan, MD Primary Cardiologist: Dr Allyson Sabal  HPI: 51 y/o with a history of atrial flutter in May of 2013. She has a TEE CV and was discharged in NSR on Metoprolol and Xarelto for one month. She saw [place] in the office in follow up and was doing well. The weekend she has felt weak. Today she took her pulse and it was 140. She took an extra Metoprolol after calling the office but her elevated HR persisted. She is admitted now from the ER. Plan is for TEE CV in am and then EP evaluation. We will slow her down with Diltiazem and Dr Allyson Sabal wants her on Heparin.   Problem List: Past Medical History  Diagnosis Date  . High cholesterol   . Atrial flutter with rapid ventricular response 09/08/2011  . Hyperlipemia 09/08/2011  . Detached retina     Past Surgical History  Procedure Date  . Appendectomy   . Tonsillectomy   . Tubal ligation   . Tee without cardioversion 09/09/2011    Procedure: TRANSESOPHAGEAL ECHOCARDIOGRAM (TEE);  Surgeon: Thurmon Fair, MD;  Location: [facility] ENDOSCOPY;  Service: Cardiovascular;  Laterality: N/A;  anes. not able to come fo cardioversion / pt TBA but in ER     Allergies:  Allergies  Allergen Reactions  . Wellbutrin (Bupropion) Anaphylaxis  . Statins Other (See Comments)    Myalgias with Crestor and Lipitor 8 yrs ago     Home Medications  (Not in a hospital admission)   Family History  Problem Relation Age of Onset  . Stroke Mother   . Hypertension Mother   . Coronary artery disease Father   . Coronary artery disease Brother      History   Social History  . Marital Status: Divorced    Spouse Name: N/A    Number of Children: N/A  . Years of Education: N/A   Occupational History  . Not on file.   Social History Main Topics  . Smoking status: Former Smoker    Quit date: 05/04/2001  . Smokeless tobacco: Never Used  . Alcohol Use: 1.2  oz/week    2 Cans of beer per week  . Drug Use: No  . Sexually Active:    Other Topics Concern  . Not on file   Social History Narrative  . No narrative on file     Review of Systems: General: negative for chills, fever, night sweats or weight changes.  HEENT- she had surgery for retinal detachment 4 weeks ago. Cardiovascular: negative for chest pain, dyspnea on exertion, edema, orthopnea, palpitations, paroxysmal nocturnal dyspnea or shortness of breath Dermatological: negative for rash Respiratory: negative for cough or wheezing Urologic: negative for hematuria Abdominal: negative for nausea, vomiting, diarrhea, bright red blood per rectum, melena, or hematemesis Neurologic: negative for visual changes, syncope, or dizziness All other systems reviewed and are otherwise negative except as noted above.  Physical Exam: Blood pressure 136/87, pulse 143, temperature 97.8 F (36.6 C), temperature source Oral, resp. rate 16, SpO2 98.00%.  General appearance: alert, cooperative, no distress and moderately obese Neck: no adenopathy, no carotid bruit, no JVD, supple, symmetrical, trachea midline and thyroid not enlarged, symmetric, no tenderness/mass/nodules Lungs: clear to auscultation bilaterally Heart: irregularly irregular rhythm Abdomen: obese, non tender Extremities: extremities normal, atraumatic, no cyanosis or edema Pulses: 2+ and symmetric Skin: Skin color, texture, turgor normal. No rashes or lesions Neurologic: Grossly normal  Labs:  No results found for this or any previous visit (from the past 24 hour(s)).   Radiology/Studies: No results found.  EKG:A flutter with VR 120-140  ASSESSMENT AND PLAN:  Principal Problem:  *Atrial flutter with rapid ventricular response, recurrent Active Problems:  Hyperlipemia  Depression  Family history of acute myocardial infarction  Plan- Admit, Diltiazem, Heparin. Possible TEE CV in am. EP consult for possible  RFA.  Deland Pretty, PA-C 12/02/2011, 6:20 PM Agree with note written by Corine Shelter PAC  Pt well known to me. Recurrent aflutter with RVR. Admit, iv hep/dilt for rate control. Plan TEE DCCV  Followed by EP consult for possible aflutter ablation.  Runell Gess 12/03/2011 6:34 AM

## 2011-12-02 NOTE — ED Provider Notes (Signed)
History     CSN: [id number]  Arrival date & time 12/02/11  1729   First MD Initiated Contact with Patient 12/02/11 1755      Chief Complaint  Patient presents with  . Palpitations    (Consider location/radiation/quality/duration/timing/severity/associated sxs/prior treatment) Patient is a 51 y.o. female presenting with general illness. The history is provided by medical records and the patient.  Illness  The current episode started 3 to 5 days ago. The onset was gradual. The problem occurs continuously. The problem has been unchanged. The problem is moderate. Nothing relieves the symptoms. Nothing aggravates the symptoms. Pertinent negatives include no fever, no abdominal pain, no diarrhea, no nausea, no vomiting, no congestion, no headaches, no rhinorrhea, no cough, no URI and no rash. There were no sick contacts. She has received no recent medical care.    Past Medical History  Diagnosis Date  . High cholesterol   . Atrial flutter with rapid ventricular response 09/08/2011  . Hyperlipemia 09/08/2011  . Detached retina     Past Surgical History  Procedure Date  . Appendectomy   . Tonsillectomy   . Tubal ligation   . Tee without cardioversion 09/09/2011    Procedure: TRANSESOPHAGEAL ECHOCARDIOGRAM (TEE);  Surgeon: Thurmon Fair, MD;  Location: [facility] ENDOSCOPY;  Service: Cardiovascular;  Laterality: N/A;  anes. not able to come fo cardioversion / pt TBA but in ER    Family History  Problem Relation Age of Onset  . Stroke Mother   . Hypertension Mother   . Coronary artery disease Father   . Coronary artery disease Brother     History  Substance Use Topics  . Smoking status: Former Smoker    Quit date: 05/04/2001  . Smokeless tobacco: Never Used  . Alcohol Use: 1.2 oz/week    2 Cans of beer per week    OB History    Grav Para Term Preterm Abortions TAB SAB Ect Mult Living                  Review of Systems  Constitutional: Positive for fatigue. Negative for fever and  chills.  HENT: Negative for congestion and rhinorrhea.   Respiratory: Negative for cough and shortness of breath.   Cardiovascular: Negative for chest pain, palpitations and leg swelling.  Gastrointestinal: Negative for nausea, vomiting, abdominal pain and diarrhea.  Musculoskeletal: Negative for back pain.  Skin: Negative for color change and rash.  Neurological: Negative for light-headedness and headaches.  Psychiatric/Behavioral: Negative for confusion.  All other systems reviewed and are negative.    Allergies  Wellbutrin and Statins  Home Medications   Current Outpatient Rx  Name Route Sig Dispense Refill  . ASPIRIN 81 MG PO TBEC Oral Take 162 mg by mouth daily.    . FENOFIBRATE 160 MG PO TABS Oral Take 160 mg by mouth at bedtime.     Marland Kitchen METOPROLOL TARTRATE 25 MG PO TABS Oral Take 25 mg by mouth 2 (two) times daily.    Marland Kitchen PREDNISOLONE ACETATE 1 % OP SUSP Right Eye Place 1 drop into the right eye daily.    Marland Kitchen ROSUVASTATIN CALCIUM 5 MG PO TABS Oral Take 5 mg by mouth 3 (three) times a week.    . VENLAFAXINE HCL ER 150 MG PO CP24 Oral Take 150 mg by mouth 2 (two) times daily.      BP 136/87  Pulse 143  Temp 97.8 F (36.6 C) (Oral)  Resp 16  SpO2 98%  Physical Exam  Nursing note  and vitals reviewed. Constitutional: She is oriented to person, place, and time. She appears well-developed and well-nourished.  HENT:  Head: Normocephalic and atraumatic.  Eyes: Pupils are equal, round, and reactive to light.  Cardiovascular: Regular rhythm, normal heart sounds and intact distal pulses.  Tachycardia present.   Pulmonary/Chest: Effort normal and breath sounds normal. No respiratory distress.  Abdominal: Soft. She exhibits no distension. There is no tenderness.  Musculoskeletal: She exhibits no edema.  Neurological: She is alert and oriented to person, place, and time.  Skin: Skin is warm and dry.  Psychiatric: She has a normal mood and affect.    ED Course  Procedures  (including critical care time)  Labs Reviewed  BASIC METABOLIC PANEL - Abnormal; Notable for the following:    Glucose, Bld 119 (*)     BUN 27 (*)     GFR calc non Af Amer 73 (*)     GFR calc Af Amer 85 (*)     All other components within normal limits   No results found.   Date: 12/02/2011  Rate: 145  Rhythm: atrial flutter  QRS Axis: normal  Intervals: normal  ST/T Wave abnormalities: nonspecific ST changes  Conduction Disutrbances:none  Narrative Interpretation:   Old EKG Reviewed: unchanged 09/07/11    1. Atrial flutter       MDM  She now presents with 3 days of generalized malaise and fatigue. She denies any focal infectious symptoms no chest pain, shortness of breath, palpitations. She states that she took her blood pressure this afternoon and noted that it was on the low side with a systolic in the 90s, and her heart rate was in the 130s. She states that she was admitted back in may with palpitations, and was found to be in atrial flutter requiring cardioversion. She called her cardiologist's office today, and was advised to take an extra dose of her metoprolol. This did nothing to help her heart rate, so she was advised to come to the ED. At this point in time she is atrial flutter in the 140s. Will treat the patient with diltiazem, check basic labs to that for possible etiology, and admit her for further management. The patient is understanding with this plan.        Theotis Burrow, MD 12/03/11 [phone]

## 2011-12-02 NOTE — ED Notes (Signed)
Report given to Samantha, RN

## 2011-12-02 NOTE — ED Notes (Signed)
Given snack pack 

## 2011-12-02 NOTE — ED Notes (Signed)
Cardiology at the bedside to evaluate the patient

## 2011-12-02 NOTE — ED Notes (Signed)
Dr. Weldon Inches at the bedside to evaluate the patient

## 2011-12-02 NOTE — ED Provider Notes (Signed)
I saw and evaluated the patient, reviewed the resident's note and I agree with the findings and plan. Has hx of afib that required cardioversion for tx.  Has not felt well a few days. No pain.  No n/v/f/c/cough/sob/uti sxs.   Felt dizzy today.   pe has aflutter with rvr.   Will give dilt for control.   Cards to admit.    Cheri Guppy, MD 12/02/11 [phone]

## 2011-12-02 NOTE — Consult Note (Signed)
ANTICOAGULATION CONSULT NOTE - Initial Consult  Pharmacy Consult for Heparin Indication: aflutter  Allergies  Allergen Reactions  . Wellbutrin (Bupropion) Anaphylaxis  . Statins Other (See Comments)    Myalgias with Crestor and Lipitor 8 yrs ago    Patient Measurements: Height: 5\' 6"  (167.6 cm) Weight: 210 lb (95.255 kg) IBW/kg (Calculated) : 59.3  Heparin Dosing Weight: 80.3kg  Vital Signs: Temp: 97.8 F (36.6 C) (07/31 1732) Temp src: Oral (07/31 1732) BP: 136/87 mmHg (07/31 1759) Pulse Rate: 143  (07/31 1759)  Labs:  Basename 12/02/11 1808  HGB 14.1  HCT 42.8  PLT 293  APTT --  LABPROT --  INR --  HEPARINUNFRC --  CREATININE --  CKTOTAL --  CKMB --  TROPONINI --    Estimated Creatinine Clearance: 92.2 ml/min (by C-G formula based on Cr of 0.83).   Medical History: Past Medical History  Diagnosis Date  . High cholesterol   . Atrial flutter with rapid ventricular response 09/08/2011  . Hyperlipemia 09/08/2011  . Detached retina    Medications:  No anticoagulants pta  Assessment: 51yof with hx aflutter (dx'ed in May 2013) was cardioverted and discharged in NSR. She returns to the ED today with a HR of 143 and back in aflutter. She will begin heparin while awaiting possible TEE/cardioversion in the morning as well as an EP consult. CBC wnl. BMET pending but was wnl back in May.  Goal of Therapy:  Heparin level 0.3-0.7 units/ml Monitor platelets by anticoagulation protocol: Yes   Plan:  1) Heparin bolus 4000 units x 1 2) Heparin drip at 1400 units/hr 3) 6h heparin level 4) Daily heparin level and CBC  Fredrik Rigger 12/02/2011,6:49 PM

## 2011-12-02 NOTE — ED Notes (Signed)
Reports having palpitations since yesterday, mild sob. Reports hx of cardioversion for atrial flutter approx 12 weeks ago.

## 2011-12-03 DIAGNOSIS — I4892 Unspecified atrial flutter: Principal | ICD-10-CM

## 2011-12-03 LAB — CBC
HCT: 38.5 % (ref 36.0–46.0)
Hemoglobin: 13.1 g/dL (ref 12.0–15.0)
RBC: 4.21 MIL/uL (ref 3.87–5.11)
WBC: 6.7 10*3/uL (ref 4.0–10.5)

## 2011-12-03 LAB — HEPARIN LEVEL (UNFRACTIONATED): Heparin Unfractionated: 0.44 IU/mL (ref 0.30–0.70)

## 2011-12-03 LAB — GLUCOSE, CAPILLARY: Glucose-Capillary: 118 mg/dL — ABNORMAL HIGH (ref 70–99)

## 2011-12-03 MED ORDER — DILTIAZEM HCL 100 MG IV SOLR
5.0000 mg/h | INTRAVENOUS | Status: DC
  Administered 2011-12-03 – 2011-12-04 (×2): 15 mg/h via INTRAVENOUS
  Filled 2011-12-03 (×5): qty 100

## 2011-12-03 MED ORDER — DIGOXIN 250 MCG PO TABS
0.2500 mg | ORAL_TABLET | Freq: Every day | ORAL | Status: DC
  Administered 2011-12-04: 0.25 mg via ORAL
  Filled 2011-12-03: qty 1

## 2011-12-03 MED ORDER — DIGOXIN 250 MCG PO TABS
0.2500 mg | ORAL_TABLET | Freq: Once | ORAL | Status: AC
  Administered 2011-12-03: 0.25 mg via ORAL
  Filled 2011-12-03: qty 1

## 2011-12-03 MED ORDER — SODIUM CHLORIDE 0.9 % IV BOLUS (SEPSIS)
250.0000 mL | Freq: Once | INTRAVENOUS | Status: AC
  Administered 2011-12-03: 250 mL via INTRAVENOUS

## 2011-12-03 MED ORDER — DILTIAZEM HCL 100 MG IV SOLR: 5.0000 mg/h | Freq: Once | INTRAVENOUS | Status: DC

## 2011-12-03 MED ORDER — ROSUVASTATIN CALCIUM 5 MG PO TABS
5.0000 mg | ORAL_TABLET | ORAL | Status: DC
  Filled 2011-12-03: qty 1

## 2011-12-03 MED ORDER — DIGOXIN 0.25 MG/ML IJ SOLN
0.5000 mg | Freq: Once | INTRAMUSCULAR | Status: AC
  Administered 2011-12-03: 0.5 mg via INTRAVENOUS
  Filled 2011-12-03: qty 2

## 2011-12-03 MED ORDER — SODIUM CHLORIDE 0.9 % IV SOLN
INTRAVENOUS | Status: DC
  Administered 2011-12-04: 05:00:00 via INTRAVENOUS

## 2011-12-03 NOTE — Progress Notes (Signed)
ANTICOAGULATION CONSULT NOTE - Follow Up Consult  Pharmacy Consult for heparin Indication: afib/aflutter  Allergies  Allergen Reactions  . Wellbutrin (Bupropion) Anaphylaxis  . Statins Other (See Comments)    Myalgias with Crestor and Lipitor 8 yrs ago    Patient Measurements: Height: 5\' 6"  (167.6 cm) Weight: 210 lb (95.255 kg) IBW/kg (Calculated) : 59.3  Heparin Dosing Weight: 80.4kg  Vital Signs: Temp: 98 F (36.7 C) (08/01 1138) Temp src: Oral (08/01 1138) BP: 98/51 mmHg (08/01 1300) Pulse Rate: 74  (08/01 1300)  Labs:  Basename 12/03/11 1245 12/03/11 0142 12/02/11 1840 12/02/11 1808  HGB -- 13.1 -- 14.1  HCT -- 38.5 -- 42.8  PLT -- 253 -- 293  APTT -- -- -- --  LABPROT -- -- 12.5 --  INR -- -- 0.91 --  HEPARINUNFRC 0.28* 0.44 -- --  CREATININE -- -- 0.86 0.89  CKTOTAL -- -- -- --  CKMB -- -- -- --  TROPONINI -- -- -- --    Estimated Creatinine Clearance: 89 ml/min (by C-G formula based on Cr of 0.86).   Medications:  Scheduled:    . aspirin EC  162 mg Oral Daily  . atorvastatin  10 mg Oral q1800  . digoxin  0.5 mg Intravenous Once  . digoxin  0.25 mg Oral Once  . digoxin  0.25 mg Oral Once  . digoxin  0.25 mg Oral Daily  . diltiazem (CARDIZEM) infusion  5-15 mg/hr Intravenous Once  . diltiazem (CARDIZEM) infusion  5-15 mg/hr Intravenous Once  . diltiazem  20 mg Intravenous Once  . diltiazem  30 mg Intravenous Once  . fenofibrate  160 mg Oral QHS  . heparin  4,000 Units Intravenous Once  . metoprolol tartrate  25 mg Oral BID  . prednisoLONE acetate  1 drop Right Eye Daily  . venlafaxine XR  150 mg Oral BID   Infusions:    . diltiazem (CARDIZEM) infusion 5 mg/hr (12/03/11 0400)  . heparin 1,400 Units/hr (12/03/11 1023)    Assessment: 51 yo female admitted 7/31 with aflutter. HL SUB-therapeutic at 0.28 on 1400 units/hr. Hb 13.1 and stable. Plts ok at 253. Per RN - no issues with infusion line or bleeding noted. RN did note that heparin gtt was  held briefly to push digoxin. Other digoxin doses given per tube.  Possible TEE/cardioversion to be performed 8/2.  Goal of Therapy:  Heparin level 0.3-0.7 units/ml Monitor platelets by anticoagulation protocol: Yes   Plan:  Increase heparin infusion to 1550 units/hr Check anti-Xa level in 6 hours and daily while on heparin Continue to monitor H&H and platelets   Thank you for the consult.   Tomi Bamberger, PharmD Clinical Pharmacist Pager: [phone] Pharmacy: [phone] 12/03/2011 2:05 PM

## 2011-12-03 NOTE — Progress Notes (Signed)
Subjective:  No complaints except some dizziness when up.  Objective:  Vital Signs in the last 24 hours: Temp:  [97.8 F (36.6 C)-98.9 F (37.2 C)] 97.9 F (36.6 C) (08/01 0754) Pulse Rate:  [57-143] 141  (08/01 0800) Resp:  [11-25] 19  (08/01 0800) BP: (74-136)/(37-92) 106/82 mmHg (08/01 0800) SpO2:  [94 %-99 %] 97 % (08/01 0800) Weight:  [95.255 kg (210 lb)] 95.255 kg (210 lb) (07/31 1800)  Intake/Output from previous day:  Intake/Output Summary (Last 24 hours) at 12/03/11 0840 Last data filed at 12/03/11 0800  Gross per 24 hour  Intake    308 ml  Output    500 ml  Net   -192 ml    Physical Exam: General appearance: alert, cooperative and no distress Lungs: clear to auscultation bilaterally Heart: irregularly irregular rhythm   Rate: 140  Rhythm: atrial flutter, she did slow some during the night, remains A flutter  Lab Results:  Basename 12/03/11 0142 12/02/11 1808  WBC 6.7 6.4  HGB 13.1 14.1  PLT 253 293    Basename 12/02/11 1840 12/02/11 1808  NA 141 142  K 3.8 3.9  CL 101 102  CO2 28 28  GLUCOSE 117* 119*  BUN 27* 27*  CREATININE 0.86 0.89   No results found for this basename: TROPONINI:2,CK,MB:2 in the last 72 hours Hepatic Function Panel  Basename 12/02/11 1840  PROT 7.3  ALBUMIN 4.2  AST 19  ALT 21  ALKPHOS 61  BILITOT 0.2*  BILIDIR --  IBILI --   No results found for this basename: CHOL in the last 72 hours  Basename 12/02/11 1840  INR 0.91    Imaging: No results found.  Cardiac Studies:  Assessment/Plan:   Principal Problem:  *Atrial flutter with rapid ventricular response, recurrent Active Problems:  Hyperlipemia  Depression  Family history of acute myocardial infarction  Plan- Unable to do TEE CV today. Will ask EP to see. Add lanoxin as this helped last admission.    Corine Shelter PA-C 12/03/2011, 8:40 AM

## 2011-12-03 NOTE — Care Management Note (Signed)
    Page 1 of 1   12/03/2011     10:28:10 AM   CARE MANAGEMENT NOTE 12/03/2011  Patient:  [name] [name], [name] [name]   Account Number:  [id number]  Date Initiated:  12/03/2011  Documentation initiated by:  Avie Arenas  Subjective/Objective Assessment:   Afib/flutter.     Action/Plan:   Anticipated DC Date:  12/07/2011   Anticipated DC Plan:  HOME/SELF CARE      DC Planning Services  CM consult      Choice offered to / List presented to:             Status of service:  In process, will continue to follow Medicare Important Message given?   (If response is "NO", the following Medicare IM given date fields will be blank) Date Medicare IM given:   Date Additional Medicare IM given:    Discharge Disposition:    Per UR Regulation:  Reviewed for med. necessity/level of care/duration of stay  If discussed at Long Length of Stay Meetings, dates discussed:    Comments:  ContactDoneisha, Ivey Mother [phone]                 Seymour,Cindy Friend [phone]

## 2011-12-03 NOTE — Progress Notes (Signed)
Pt. Seen and examined. Agree with the NP/PA-C note as written. Recurrent typical atrial flutter with 2:1 and variable conduction on flecainide and b-blocker. Rate remains fast despite cardizem at 15 mg/hr. Will add digoxin load today .Marland Kitchen She is mildly hypotensive. On heparin but has been off of anticoagulation. Will need TEE prior to cardioversion or ablation - will try to arrange for tomorrow. Consult EP today for ablation evaluation.  Chrystie Nose, MD, [facility] Attending Cardiologist [facility]

## 2011-12-03 NOTE — Progress Notes (Signed)
Spoke with Hinda Glatter, PA regarding pt remaining in 140s with HR, even after increasing cardizem and administering IV & PO digoxin. Pt is diaphoretic and clammy.  Pt BP remains soft 90s/60s. [place] gave parameters to keep SBP >95.

## 2011-12-03 NOTE — Consult Note (Signed)
ANTICOAGULATION CONSULT NOTE   Pharmacy Consult for Heparin Indication: aflutter  Allergies  Allergen Reactions  . Wellbutrin (Bupropion) Anaphylaxis  . Statins Other (See Comments)    Myalgias with Crestor and Lipitor 8 yrs ago    Patient Measurements: Height: 5\' 6"  (167.6 cm) Weight: 210 lb (95.255 kg) IBW/kg (Calculated) : 59.3  Heparin Dosing Weight: 80.3kg  Vital Signs: Temp: 97.8 F (36.6 C) (07/31 1732) Temp src: Oral (07/31 1732) BP: 86/52 mmHg (08/01 0300) Pulse Rate: 61  (08/01 0300)  Labs:  Basename 12/03/11 0142 12/02/11 1840 12/02/11 1808  HGB 13.1 -- 14.1  HCT 38.5 -- 42.8  PLT 253 -- 293  APTT -- -- --  LABPROT -- 12.5 --  INR -- 0.91 --  HEPARINUNFRC 0.44 -- --  CREATININE -- 0.86 0.89  CKTOTAL -- -- --  CKMB -- -- --  TROPONINI -- -- --    Estimated Creatinine Clearance: 89 ml/min (by C-G formula based on Cr of 0.86).  Assessment: 51 yo female with aflutter for Heparin  Goal of Therapy:  Heparin level 0.3-0.7 units/ml Monitor platelets by anticoagulation protocol: Yes   Plan:  Continue Heparin at current rate    Shundra Wirsing, Gary Fleet 12/03/2011,3:33 AM

## 2011-12-03 NOTE — Progress Notes (Signed)
Pt. SBP low 80's. HR 130's A flutter  Pt. Asymptomatic resting well. Dr. Gala Romney made aware.

## 2011-12-03 NOTE — Progress Notes (Signed)
ANTICOAGULATION CONSULT NOTE - Follow Up Consult  Pharmacy Consult for Heparin Indication: afib/aflutter  Allergies  Allergen Reactions  . Wellbutrin (Bupropion) Anaphylaxis  . Statins Other (See Comments)    Myalgias with Crestor and Lipitor 8 yrs ago    Patient Measurements: Height: 5\' 6"  (167.6 cm) Weight: 210 lb (95.255 kg) IBW/kg (Calculated) : 59.3  Heparin Dosing Weight: 80.4kg  Vital Signs: Temp: 98.4 F (36.9 C) (08/01 2000) Temp src: Oral (08/01 2000) BP: 114/42 mmHg (08/01 2200) Pulse Rate: 143  (08/01 2200)  Labs:  Basename 12/03/11 2125 12/03/11 1245 12/03/11 0142 12/02/11 1840 12/02/11 1808  HGB -- -- 13.1 -- 14.1  HCT -- -- 38.5 -- 42.8  PLT -- -- 253 -- 293  APTT -- -- -- -- --  LABPROT -- -- -- 12.5 --  INR -- -- -- 0.91 --  HEPARINUNFRC 0.49 0.28* 0.44 -- --  CREATININE -- -- -- 0.86 0.89  CKTOTAL -- -- -- -- --  CKMB -- -- -- -- --  TROPONINI -- -- -- -- --    Estimated Creatinine Clearance: 89 ml/min (by C-G formula based on Cr of 0.86).   Medications:  Infusions:     . sodium chloride 75 mL/hr at 12/03/11 2204  . diltiazem (CARDIZEM) infusion 15 mg/hr (12/03/11 1920)  . heparin 1,550 Units/hr (12/03/11 1406)    Assessment: 51 yo female on heparin for Aflutter. Heparin level is therapeutic on 1550 units/hr. No bleeding noted.  Goal of Therapy:  Heparin level 0.3-0.7 units/ml Monitor platelets by anticoagulation protocol: Yes   Plan:  -Continue heparin drip at 1550 units/hr -Daily heparin level and CBC while on heparin   [facility], [place].D., BCPS Clinical Pharmacist Pager: [phone] 12/03/2011 10:42 PM

## 2011-12-03 NOTE — Consult Note (Signed)
ELECTROPHYSIOLOGY CONSULT NOTE    Patient ID: [name] [name] MRN: [id number], DOB/AGE: 05-09-1960 51 y.o.  Admit date: 12/02/2011 Date of Consult: 12-03-2011  Primary Physician: Maryelizabeth Rowan, MD Primary Cardiologist: Zoila Shutter, MD  Reason for Consultation: atrial flutter  HPI:  [name] [name] is a 51 year old female with recurrent atrial flutter.  EP has been asked to evaluate for treatment options.  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  She was first diagnosed with atrial flutter in May of this year.  At that time, she presented to the ER after being symptomatic with tachy palpitations and dizziness.  She was found to be in rapid atrial flutter and was given one dose of Flecainide.  She converted and was discharged to home.  The next morning, she had return of her her symptoms and came back to the hospital for further evaluation.  She was admitted and placed on IV Diltiazem for rate control.  She underwent TEE guided DCCV and was discharged to home on Xarelto and Metoprolol.  She took Xarelto for 30 days post DCCV and then discontinued as instructed.  She has continued taking Metoprolol.   She did well until this past weekend when she again noticed fatigue and tachypalpitations (although not as severe as before).  She presented to the hospital later in the week and was again found to be in atrial flutter with RVR.  She was placed on IV Heparin, Diltiazem, and Digoxin.  Plans are for TEE/DCCV tomorrow morning.  Her previous cardiac workup is notable for a TEE in May which demonstrated a normal EF and normal LA size (however, no measurement is given). According to hospital notes in May, there were plans for an outpatient myoview, but this has not yet been done.   She denies chest pain, shortness of breath,syncope, dizziness, fatigue, exercise intolerance while in sinus rhythm.  She is  symptomatic with atrial flutter with shortness of breath and dizziness.    We spoke with [facility] regarding her recent surgery for retinal detachment.   They are advising waiting to ablate the patient until the week of August 12th because of restrictions on lying flat post retinal surgery.   Past Medical History  Diagnosis Date  . High cholesterol   . Atrial flutter with rapid ventricular response 09/08/2011  . Hyperlipemia 09/08/2011  . Detached retina      Surgical History:  Past Surgical History  Procedure Date  . Appendectomy   . Tonsillectomy   . Tubal ligation   . Tee without cardioversion 09/09/2011    Procedure: TRANSESOPHAGEAL ECHOCARDIOGRAM (TEE);  Surgeon: Thurmon Fair, MD;  Location: [facility] ENDOSCOPY;  Service: Cardiovascular;  Laterality: N/A;  anes. not able to come fo cardioversion / pt TBA but in ER     Prescriptions prior to admission  Medication Sig Dispense Refill  . aspirin 81 MG EC tablet Take 162 mg by mouth daily.      . fenofibrate 160 MG tablet Take 160 mg by mouth at bedtime.       . metoprolol tartrate (LOPRESSOR) 25 MG tablet Take 25 mg by mouth 2 (two) times daily.      . prednisoLONE acetate (PRED FORTE) 1 % ophthalmic suspension Place 1 drop into the right eye daily.      . rosuvastatin (CRESTOR) 5 MG tablet Take 5 mg by mouth 3 (three) times a week.      Marland Kitchen  venlafaxine XR (EFFEXOR-XR) 150 MG 24 hr capsule Take 150 mg by mouth 2 (two) times daily.        Inpatient Medications:    . aspirin EC  162 mg Oral Daily  . atorvastatin  10 mg Oral q1800  . digoxin  0.25 mg Oral Once  . diltiazem (CARDIZEM) infusion  5-15 mg/hr Intravenous Once  . fenofibrate  160 mg Oral QHS  . heparin  4,000 Units Intravenous Once  . metoprolol tartrate  25 mg Oral BID  . prednisoLONE acetate  1 drop Right Eye Daily  . venlafaxine XR  150 mg Oral BID   Allergies:  Allergies  Allergen Reactions  . Wellbutrin (Bupropion) Anaphylaxis  . Statins Other (See  Comments)    Myalgias with Crestor and Lipitor 8 yrs ago   History   Social History  . Marital Status: Divorced    Spouse Name: N/A    Number of Children: N/A  . Years of Education: N/A   Occupational History  . Not on file.   Social History Main Topics  . Smoking status: Former Smoker    Quit date: 05/04/2001  . Smokeless tobacco: Never Used  . Alcohol Use: 1.2 oz/week    2 Cans of beer per week  . Drug Use: No  . Sexually Active:    Other Topics Concern  . Not on file   Social History Narrative  . No narrative on file    Family History  Problem Relation Age of Onset  . Stroke Mother   . Hypertension Mother   . Coronary artery disease Father   . Coronary artery disease Brother     Physical Exam: Filed Vitals:   12/03/11 1700 12/03/11 1800 12/03/11 1900 12/03/11 2000  BP: 109/59 93/61 115/69 95/46  Pulse: 144 141 134 147  Temp:      TempSrc:      Resp: 25 21 26 19   Height:      Weight:      SpO2: 96% 96% 96% 95%    GEN- The patient is well appearing, alert and oriented x 3 today.   Head- normocephalic, atraumatic Eyes-  Sclera clear, conjunctiva pink Ears- hearing intact Oropharynx- clear Neck- supple, no JVP Lymph- no cervical lymphadenopathy Lungs- Clear to ausculation bilaterally, normal work of breathing Heart- tachy irregular rhythm, no murmurs, rubs or gallops, PMI not laterally displaced GI- soft, NT, ND, + BS Extremities- no clubbing, cyanosis, or edema MS- no significant deformity or atrophy Skin- no rash or lesion Psych- euthymic mood, full affect Neuro- strength and sensation are intact    Labs:   Lab Results  Component Value Date   WBC 6.7 12/03/2011   HGB 13.1 12/03/2011   HCT 38.5 12/03/2011   MCV 91.4 12/03/2011   PLT 253 12/03/2011    Lab 12/02/11 1840  NA 141  K 3.8  CL 101  CO2 28  BUN 27*  CREATININE 0.86  CALCIUM 10.3  PROT 7.3  BILITOT 0.2*  ALKPHOS 61  ALT 21  AST 19  GLUCOSE 117*    EAV:[id number] flutter with  RVR  TELEMETRY: atrial flutter with controlled ventricular response  Assessment/Impression/Plan:  1. Typical atrial flutter The patient has symptomatic recurrent typical appearing atrial flutter.  Heart rates are presently quite elevated.  She is on a heparin gtt at this time and plans for TEE guided cardioversion are noted.  She will require adequate anticoagulation following cardioversion, likely coumadin with lovenox bridge.  I will defer this  decision to her primary team. EP has been asked to assess for possible ablation.  She is a good candidate for ablation. Risk, benefits, and alternatives to EP study and radiofrequency ablation for atrial flutter were also discussed in detail today. These risks include but are not limited to stroke, bleeding, vascular damage, tamponade, perforation, damage to the heart and other structures, AV block requiring pacemaker, worsening renal function, and death. The patient understands these risk and wishes to proceed.   She is scheduled for ablation with Dr Ladona Ridgel on August 13th.  She would be a good candidate for the Block CTI trial.  We will try to have our research team discuss this with her in the am.  She should continue therapeutic anticoagulation until after her ablation (4 weeks from tomorrow's cardioversion).  We will see as needed while here. Please call with questions.  Fayrene Fearing Orey Moure,MD

## 2011-12-04 ENCOUNTER — Encounter: Payer: Self-pay | Admitting: *Deleted

## 2011-12-04 ENCOUNTER — Inpatient Hospital Stay (HOSPITAL_COMMUNITY): Payer: BC Managed Care – PPO | Admitting: Anesthesiology

## 2011-12-04 ENCOUNTER — Encounter (HOSPITAL_COMMUNITY): Payer: Self-pay

## 2011-12-04 ENCOUNTER — Encounter (HOSPITAL_COMMUNITY): Payer: Self-pay | Admitting: Anesthesiology

## 2011-12-04 ENCOUNTER — Encounter (HOSPITAL_COMMUNITY)
Admission: EM | Disposition: A | Discharge status: Home / Self Care | Payer: Self-pay | Source: Home / Self Care | Attending: Cardiovascular Disease

## 2011-12-04 DIAGNOSIS — Z7901 Long term (current) use of anticoagulants: Secondary | ICD-10-CM

## 2011-12-04 HISTORY — PX: TEE WITHOUT CARDIOVERSION: SHX5443

## 2011-12-04 HISTORY — PX: CARDIOVERSION: SHX1299

## 2011-12-04 LAB — BASIC METABOLIC PANEL
BUN: 14 mg/dL (ref 6–23)
CO2: 23 mEq/L (ref 19–32)
Calcium: 9.3 mg/dL (ref 8.4–10.5)
Chloride: 108 mEq/L (ref 96–112)
Creatinine, Ser: 0.79 mg/dL (ref 0.50–1.10)
GFR calc Af Amer: >90 mL/min (ref >90)
GFR calc non Af Amer: >90 mL/min (ref >90)
Glucose, Bld: 100 mg/dL — ABNORMAL HIGH (ref 70–99)
Potassium: 4 mEq/L (ref 3.5–5.1)
Sodium: 143 mEq/L (ref 135–145)

## 2011-12-04 LAB — CBC
MCHC: 33.3 g/dL (ref 30.0–36.0)
Platelets: 211 10*3/uL (ref 150–400)
RDW: 13.3 % (ref 11.5–15.5)
WBC: 4.7 10*3/uL (ref 4.0–10.5)

## 2011-12-04 SURGERY — ECHOCARDIOGRAM, TRANSESOPHAGEAL
Anesthesia: Moderate Sedation

## 2011-12-04 MED ORDER — FENTANYL CITRATE 0.05 MG/ML IJ SOLN
INTRAMUSCULAR | Status: DC | PRN
  Administered 2011-12-04 (×2): 25 ug via INTRAVENOUS

## 2011-12-04 MED ORDER — RIVAROXABAN 20 MG PO TABS: 20.0000 mg | ORAL_TABLET | Freq: Every day | ORAL | Status: DC

## 2011-12-04 MED ORDER — MIDAZOLAM HCL 10 MG/2ML IJ SOLN
INTRAMUSCULAR | Status: AC
  Filled 2011-12-04: qty 2

## 2011-12-04 MED ORDER — BUTAMBEN-TETRACAINE-BENZOCAINE 2-2-14 % EX AERO
INHALATION_SPRAY | CUTANEOUS | Status: DC | PRN
  Administered 2011-12-04: 2 via TOPICAL

## 2011-12-04 MED ORDER — PROPOFOL 10 MG/ML IV BOLUS
INTRAVENOUS | Status: DC | PRN
  Administered 2011-12-04: 100 mg via INTRAVENOUS

## 2011-12-04 MED ORDER — FENTANYL CITRATE 0.05 MG/ML IJ SOLN
INTRAMUSCULAR | Status: AC
  Filled 2011-12-04: qty 2

## 2011-12-04 MED ORDER — ACETAMINOPHEN 325 MG PO TABS: 650.0000 mg | ORAL_TABLET | ORAL | Status: DC | PRN

## 2011-12-04 MED ORDER — SODIUM CHLORIDE 0.9 % IV SOLN
INTRAVENOUS | Status: DC | PRN
  Administered 2011-12-04: 13:00:00 via INTRAVENOUS

## 2011-12-04 MED ORDER — LIDOCAINE HCL (CARDIAC) 20 MG/ML IV SOLN
INTRAVENOUS | Status: DC | PRN
  Administered 2011-12-04: 40 mg via INTRAVENOUS

## 2011-12-04 MED ORDER — MIDAZOLAM HCL 10 MG/2ML IJ SOLN
INTRAMUSCULAR | Status: DC | PRN
  Administered 2011-12-04 (×2): 2 mg via INTRAVENOUS

## 2011-12-04 MED ORDER — RIVAROXABAN 20 MG PO TABS
20.0000 mg | ORAL_TABLET | Freq: Every day | ORAL | Status: DC
  Administered 2011-12-04: 20 mg via ORAL
  Filled 2011-12-04 (×2): qty 1

## 2011-12-04 NOTE — Anesthesia Postprocedure Evaluation (Signed)
  Anesthesia Post-op Note  Patient: [name] [name]  Procedure(s) Performed: Procedure(s) (LRB): TRANSESOPHAGEAL ECHOCARDIOGRAM (TEE) (N/A) CARDIOVERSION (N/A)  Patient Location: PACU and Endoscopy Unit  Anesthesia Type: General  Level of Consciousness: awake  Airway and Oxygen Therapy: Patient Spontanous Breathing  Post-op Pain: none  Post-op Assessment: Post-op Vital signs reviewed, Patient's Cardiovascular Status Stable, Respiratory Function Stable, Patent Airway, No signs of Nausea or vomiting and Pain level controlled  Post-op Vital Signs: stable  Complications: No apparent anesthesia complications

## 2011-12-04 NOTE — Progress Notes (Signed)
Patient ID: [name] [name], female   DOB: 1960-09-25, 51 y.o.   MRN: [id number] Subjective:  No chest pain or sob  Objective:  Vital Signs in the last 24 hours: Temp:  [98 F (36.7 C)-98.4 F (36.9 C)] 98.4 F (36.9 C) (08/02 0753) Pulse Rate:  [71-150] 79  (08/02 0900) Resp:  [13-32] 18  (08/02 0900) BP: (92-135)/(32-85) 109/75 mmHg (08/02 0900) SpO2:  [93 %-98 %] 98 % (08/02 0900)  Intake/Output from previous day: 08/01 0701 - 08/02 0700 In: 2098 [P.O.:480; I.V.:1618] Out: 2100 [Urine:2100] Intake/Output from this shift: Total I/O In: 316.5 [I.V.:316.5] Out: -   Physical Exam: Well appearing NAD HEENT: Unremarkable Neck:  No JVD, no thyromegally Lungs:  Clear with no wheezes. HEART:  IRegular rate rhythm, no murmurs, no rubs, no clicks Abd:  Flat, positive bowel sounds, no organomegally, no rebound, no guarding Ext:  2 plus pulses, no edema, no cyanosis, no clubbing Skin:  No rashes no nodules Neuro:  CN II through XII intact, motor grossly intact  Lab Results:  Basename 12/04/11 0415 12/03/11 0142  WBC 4.7 6.7  HGB 13.3 13.1  PLT 211 253    Basename 12/04/11 0415 12/02/11 1840  NA 143 141  K 4.0 3.8  CL 108 101  CO2 23 28  GLUCOSE 100* 117*  BUN 14 27*  CREATININE 0.79 0.86   No results found for this basename: TROPONINI:2,CK,MB:2 in the last 72 hours Hepatic Function Panel  Basename 12/02/11 1840  PROT 7.3  ALBUMIN 4.2  AST 19  ALT 21  ALKPHOS 61  BILITOT 0.2*  BILIDIR --  IBILI --   No results found for this basename: CHOL in the last 72 hours No results found for this basename: PROTIME in the last 72 hours  Imaging: No results found.  Cardiac Studies: Tele - atrial flutter with an RVR Assessment/Plan:  1. Atrial flutter 2. HTN Rec; I concur with the plans as noted. TEE/DCCV today with plans for ablation on8/13. Continue Xarelto after the procedure.  Lewayne Bunting, M.D.  LOS: 2 days    Lewayne Bunting 12/04/2011, 10:49 AM

## 2011-12-04 NOTE — Progress Notes (Signed)
Subjective:  "weak" otherwise no complaints  Objective:  Vital Signs in the last 24 hours: Temp:  [98 F (36.7 C)-98.4 F (36.9 C)] 98.4 F (36.9 C) (08/02 0753) Pulse Rate:  [71-150] 78  (08/02 0700) Resp:  [13-32] 16  (08/02 0700) BP: (91-135)/(32-85) 110/50 mmHg (08/02 0700) SpO2:  [93 %-98 %] 95 % (08/02 0700)  Intake/Output from previous day:  Intake/Output Summary (Last 24 hours) at 12/04/11 0906 Last data filed at 12/04/11 0700  Gross per 24 hour  Intake   2040 ml  Output   1600 ml  Net    440 ml    Physical Exam: General appearance: alert, cooperative and no distress Lungs: clear to auscultation bilaterally Heart: irregularly irregular rhythm   Rate: 110  Rhythm: atrial flutter  Lab Results:  Basename 12/04/11 0415 12/03/11 0142  WBC 4.7 6.7  HGB 13.3 13.1  PLT 211 253    Basename 12/04/11 0415 12/02/11 1840  NA 143 141  K 4.0 3.8  CL 108 101  CO2 23 28  GLUCOSE 100* 117*  BUN 14 27*  CREATININE 0.79 0.86   No results found for this basename: TROPONINI:2,CK,MB:2 in the last 72 hours Hepatic Function Panel  Basename 12/02/11 1840  PROT 7.3  ALBUMIN 4.2  AST 19  ALT 21  ALKPHOS 61  BILITOT 0.2*  BILIDIR --  IBILI --   No results found for this basename: CHOL in the last 72 hours  Basename 12/02/11 1840  INR 0.91    Imaging: No results found.  Cardiac Studies:  Assessment/Plan:   Principal Problem:  *Atrial flutter with rapid ventricular response, recurrent Active Problems:  Hyperlipemia  Depression  Family history of acute myocardial infarction  Plan- For TEE CV today, RFA 12/15/11, (appreciate Dr Hart Carwin assistance). Will discuss with MD-? Myoview prior to RFA, ? Xarelto at discharge.    Corine Shelter PA-C 12/04/2011, 9:06 AM  I have seen and examined the patient along with Corine Shelter PA-C.  I have reviewed the chart, notes and new data.  I agree with PA's note.  Key new complaints: weak Key examination changes: remains in  atrial flutter with variable AV block and ventr rate in 110-120s Key new findings / data: no metabolic abnormalities; ECG with classic counterclockwise flutter  PLAN: TEE guided cardioversion today and keep on Xarelto until ablation on August 13. Her son will pick up Xarelto samples from our office.   The TEE and DCCV with sedation procedure has been fully reviewed with the patient and written informed consent has been obtained.   Thurmon Fair, MD, [facility] [phone] 12/04/2011, 9:33 AM

## 2011-12-04 NOTE — Preoperative (Signed)
Beta Blockers   Lopressor 25 mg taken @

## 2011-12-04 NOTE — Addendum Note (Signed)
Addendum  created 12/04/11 1450 by Bedelia Person, MD   Modules edited:Notes Section

## 2011-12-04 NOTE — Anesthesia Postprocedure Evaluation (Signed)
  Anesthesia Post-op Note  Patient: [name] [name]  Procedure(s) Performed: Procedure(s) (LRB): TRANSESOPHAGEAL ECHOCARDIOGRAM (TEE) (N/A) CARDIOVERSION (N/A)  Patient Location: PACU  Anesthesia Type: General  Level of Consciousness: awake, alert , oriented and patient cooperative  Airway and Oxygen Therapy: Patient Spontanous Breathing and Patient connected to nasal cannula oxygen  Post-op Pain: none  Post-op Assessment: Post-op Vital signs reviewed and Patient's Cardiovascular Status Stable  Post-op Vital Signs: Reviewed and stable  Complications: No apparent anesthesia complications

## 2011-12-04 NOTE — Progress Notes (Signed)
  Echocardiogram Echocardiogram Transesophageal has been performed.  [name] [name] 12/04/2011, 2:17 PM

## 2011-12-04 NOTE — Progress Notes (Signed)
ANTICOAGULATION CONSULT NOTE - Follow Up Consult  Pharmacy Consult for heparin Indication: afib/aflutter  Allergies  Allergen Reactions  . Wellbutrin (Bupropion) Anaphylaxis  . Statins Other (See Comments)    Myalgias with Crestor and Lipitor 8 yrs ago    Patient Measurements: Height: 5\' 6"  (167.6 cm) Weight: 210 lb (95.255 kg) IBW/kg (Calculated) : 59.3  Heparin Dosing Weight: 80.4kg  Vital Signs: Temp: 98 F (36.7 C) (08/02 1242) Temp src: Oral (08/02 0753) BP: 108/67 mmHg (08/02 1242) Pulse Rate: 96  (08/02 1242)  Labs:  Basename 12/04/11 0415 12/03/11 2125 12/03/11 1245 12/03/11 0142 12/02/11 1840 12/02/11 1808  HGB 13.3 -- -- 13.1 -- --  HCT 39.9 -- -- 38.5 -- 42.8  PLT 211 -- -- 253 -- 293  APTT -- -- -- -- -- --  LABPROT -- -- -- -- 12.5 --  INR -- -- -- -- 0.91 --  HEPARINUNFRC 0.57 0.49 0.28* -- -- --  CREATININE 0.79 -- -- -- 0.86 0.89  CKTOTAL -- -- -- -- -- --  CKMB -- -- -- -- -- --  TROPONINI -- -- -- -- -- --    Estimated Creatinine Clearance: 95.7 ml/min (by C-G formula based on Cr of 0.79).   Assessment: 51 YOF admit for aflutter (cardioverted in May to NSR)- back in aflutter on IV heparin. TEE/Cardioversion 8/2. CBC wnl. BL INR 0.91. HL therapeutic x 2 at 1550 units/hr. No bleeding noted or infusion lines issues per RN.   Goal of Therapy:  Heparin level 0.3-0.7 units/ml Monitor platelets by anticoagulation protocol: Yes   Plan:  Continue heparin gtt at 1550 unit/hr.  F/u daily HL, CBC, s/sx bleeding F/u anticoagulation plans after DCCV   Thank you for the consult.  Tomi Bamberger, PharmD Clinical Pharmacist Pager: [phone] Pharmacy: [phone] 12/04/2011 1:12 PM

## 2011-12-04 NOTE — Anesthesia Preprocedure Evaluation (Signed)
Anesthesia Evaluation  Patient identified by MRN, date of birth, ID band Patient awake    Reviewed: Allergy & Precautions, H&P , NPO status , Patient's Chart, lab work & pertinent test results  Airway Mallampati: II TM Distance: >3 FB Neck ROM: Full    Dental   Pulmonary    Pulmonary exam normal       Cardiovascular hypertension, + dysrhythmias Atrial Fibrillation Rhythm:Irregular Rate:Normal     Neuro/Psych Depression    GI/Hepatic   Endo/Other    Renal/GU      Musculoskeletal   Abdominal (+) + obese,   Peds  Hematology   Anesthesia Other Findings   Reproductive/Obstetrics                           Anesthesia Physical Anesthesia Plan  ASA: II  Anesthesia Plan: General   Post-op Pain Management:    Induction: Intravenous  Airway Management Planned: Mask  Additional Equipment:   Intra-op Plan:   Post-operative Plan:   Informed Consent: I have reviewed the patients History and Physical, chart, labs and discussed the procedure including the risks, benefits and alternatives for the proposed anesthesia with the patient or authorized representative who has indicated his/her understanding and acceptance.     Plan Discussed with: CRNA and Surgeon  Anesthesia Plan Comments:         Anesthesia Quick Evaluation

## 2011-12-04 NOTE — Transfer of Care (Signed)
Immediate Anesthesia Transfer of Care Note  Patient: [name] [name]  Procedure(s) Performed: Procedure(s) (LRB): TRANSESOPHAGEAL ECHOCARDIOGRAM (TEE) (N/A) CARDIOVERSION (N/A)  Patient Location: PACU  Anesthesia Type: General  Level of Consciousness: awake, alert , oriented and patient cooperative  Airway & Oxygen Therapy: Patient Spontanous Breathing and Patient connected to nasal cannula oxygen  Post-op Assessment: Report given to PACU RN and Post -op Vital signs reviewed and stable  Post vital signs: Reviewed and stable  Complications: No apparent anesthesia complications

## 2011-12-04 NOTE — CV Procedure (Signed)
[name],[name] Female, 51 y.o., June 21, 1960  Location: [facility]  Bed: 2105-01  MRN: [id number]  CSN: [id number]  Procedure: Electrical Cardioversion Indications:  Atrial Flutter  Procedure Details:  Consent: Risks of procedure as well as the alternatives and risks of each were explained to the (patient/caregiver).  Consent for procedure obtained.  Time Out: Verified patient identification, verified procedure, site/side was marked, verified correct patient position, special equipment/implants available, medications/allergies/relevent history reviewed, required imaging and test results available.  Performed  Patient placed on cardiac monitor, pulse oximetry, supplemental oxygen as necessary.  Sedation given: Propofol 100 mg IV, Dr. Gypsy Balsam, Anesthesiology Pacer pads placed anterior and posterior chest.  Cardioverted 1 time(s).  Cardioverted at 120J. Synchronized biphasic.  Evaluation: Findings: Post procedure EKG shows: NSR Complications: None Patient did tolerate procedure well.  Time Spent Directly with the Patient:  30 minutes   Shivon Hackel 12/04/2011, 2:55 PM

## 2011-12-04 NOTE — Anesthesia Postprocedure Evaluation (Signed)
  Anesthesia Post-op Note  Patient: [name] [name]  Procedure(s) Performed: Procedure(s) (LRB): TRANSESOPHAGEAL ECHOCARDIOGRAM (TEE) (N/A) CARDIOVERSION (N/A)  Patient Location: PACU  Anesthesia Type: General  Level of Consciousness: awake, alert , oriented and patient cooperative  Airway and Oxygen Therapy: Patient Spontanous Breathing and Patient connected to nasal cannula oxygen  Post-op Pain: none  Post-op Assessment: Post-op Vital signs reviewed and Patient's Cardiovascular Status Stable  Post-op Vital Signs: Reviewed and stable  Complications: No apparent anesthesia complications 

## 2011-12-04 NOTE — CV Procedure (Signed)
[facility] Female, 51 y.o., 05-26-1960  Location: [facility]  Bed: 2105-01  MRN: [id number]  CSN: [id number]  INDICATIONS: Atrial flutter pre-cardioversion  PROCEDURE:   Informed consent was obtained prior to the procedure. The risks, benefits and alternatives for the procedure were discussed and the patient comprehended these risks.  Risks include, but are not limited to, cough, sore throat, vomiting, nausea, somnolence, esophageal and stomach trauma or perforation, bleeding, low blood pressure, aspiration, pneumonia, infection, trauma to the teeth and death.    After a procedural time-out, the oropharynx was anesthetized with 20% benzocaine spray. The patient was given 4 mg versed and 50 mcg fentanyl for moderate sedation.   The transesophageal probe was inserted in the esophagus and stomach without difficulty and multiple views were obtained.  The patient was kept under observation until the patient left the procedure room.  The patient left the procedure room in stable condition.   Agitated microbubble saline contrast was not administered.  COMPLICATIONS:    There were no immediate complications.  FINDINGS:  No evidence of LA clot. Excellent appendage velocities. Normal LV function. No valvular abnormalities.  RECOMMENDATIONS:   Proceed with planned cardioversion, ultimately atrial flutter isthmus ablation.  Time Spent Directly with the Patient:  30 minutes   Marlys Stegmaier 12/04/2011, 2:48 PM

## 2011-12-04 NOTE — Discharge Summary (Signed)
Patient ID: [name] [name],  MRN: [id number], DOB/AGE: March 05, 1961 51 y.o.  Admit date: 12/02/2011 Discharge date: 12/04/2011  Primary Care Provider:  Primary Cardiologist: Dr Allyson Sabal  Discharge Diagnoses Principal Problem:  *Atrial flutter with rapid ventricular response, recurrent, TEE CV 12/04/11 with plans for RFA 12/15/11 Active Problems:  Anticoagulated, discharged on Xarelto  Hyperlipemia  Depression  Family history of acute myocardial infarction    Procedures: TEE CV 12/04/11   Hospital Course:  51 y/o with a history of atrial flutter in May of 2013. She had a TEE CV and was discharged in NSR on Metoprolol and Xarelto for one month. She saw [place] in the office in follow up and was doing well. This weekend she has felt weak. On the day of admission she took her pulse and it was 140. She took an extra Metoprolol after calling the office but her elevated HR persisted. She was admitted from the ER.  She was placed on IV Diltiazem and later Lanoxin was added but she had persistently high rates with borderline blood pressure. She was set up for TEE CV which was done 12/04/11 to NSR. She will see Dr Johney Frame 12/15/11 for RFA. LA size on TEE was 4cm. She is not to stop Xarelto.   Discharge Vitals:  Blood pressure 101/55, pulse 70, temperature 97.7 F (36.5 C), temperature source Oral, resp. rate 13, height 5\' 6"  (1.676 m), weight 95.255 kg (210 lb), SpO2 97.00%.    Labs: Results for orders placed during the hospital encounter of 12/02/11 (from the past 48 hour(s))  CBC     Status: Normal   Collection Time   12/02/11  6:08 PM      Component Value Range Comment   WBC 6.4  4.0 - 10.5 K/uL    RBC 4.64  3.87 - 5.11 MIL/uL    Hemoglobin 14.1  12.0 - 15.0 g/dL    HCT 82.9  56.2 - 13.0 %    MCV 92.2  78.0 - 100.0 fL    MCH 30.4  26.0 - 34.0 pg    MCHC 32.9  30.0 - 36.0 g/dL    RDW 86.5  78.4 - 69.6 %    Platelets 293  150 - 400 K/uL   BASIC METABOLIC PANEL     Status: Abnormal   Collection Time   12/02/11  6:08 PM      Component Value Range Comment   Sodium 142  135 - 145 mEq/L    Potassium 3.9  3.5 - 5.1 mEq/L    Chloride 102  96 - 112 mEq/L    CO2 28  19 - 32 mEq/L    Glucose, Bld 119 (*) 70 - 99 mg/dL    BUN 27 (*) 6 - 23 mg/dL    Creatinine, Ser 2.95  0.50 - 1.10 mg/dL    Calcium 28.4  8.4 - 10.5 mg/dL    GFR calc non Af Amer 73 (*) >90 mL/min    GFR calc Af Amer 85 (*) >90 mL/min   COMPREHENSIVE METABOLIC PANEL     Status: Abnormal   Collection Time   12/02/11  6:40 PM      Component Value Range Comment   Sodium 141  135 - 145 mEq/L    Potassium 3.8  3.5 - 5.1 mEq/L    Chloride 101  96 - 112 mEq/L    CO2 28  19 - 32 mEq/L    Glucose, Bld 117 (*) 70 - 99 mg/dL    BUN  27 (*) 6 - 23 mg/dL    Creatinine, Ser 1.61  0.50 - 1.10 mg/dL    Calcium 09.6  8.4 - 10.5 mg/dL    Total Protein 7.3  6.0 - 8.3 g/dL    Albumin 4.2  3.5 - 5.2 g/dL    AST 19  0 - 37 U/L    ALT 21  0 - 35 U/L    Alkaline Phosphatase 61  39 - 117 U/L    Total Bilirubin 0.2 (*) 0.3 - 1.2 mg/dL    GFR calc non Af Amer 76 (*) >90 mL/min    GFR calc Af Amer 88 (*) >90 mL/min   MAGNESIUM     Status: Normal   Collection Time   12/02/11  6:40 PM      Component Value Range Comment   Magnesium 2.3  1.5 - 2.5 mg/dL   PROTIME-INR     Status: Normal   Collection Time   12/02/11  6:40 PM      Component Value Range Comment   Prothrombin Time 12.5  11.6 - 15.2 seconds    INR 0.91  0.00 - 1.49   URINALYSIS, ROUTINE W REFLEX MICROSCOPIC     Status: Abnormal   Collection Time   12/02/11  6:43 PM      Component Value Range Comment   Color, Urine YELLOW  YELLOW    APPearance CLOUDY (*) CLEAR    Specific Gravity, Urine 1.029  1.005 - 1.030    pH 5.5  5.0 - 8.0    Glucose, UA NEGATIVE  NEGATIVE mg/dL    Hgb urine dipstick NEGATIVE  NEGATIVE    Bilirubin Urine NEGATIVE  NEGATIVE    Ketones, ur NEGATIVE  NEGATIVE mg/dL    Protein, ur NEGATIVE  NEGATIVE mg/dL    Urobilinogen, UA 1.0  0.0 - 1.0 mg/dL    Nitrite  NEGATIVE  NEGATIVE    Leukocytes, UA MODERATE (*) NEGATIVE   URINE MICROSCOPIC-ADD ON     Status: Normal   Collection Time   12/02/11  6:43 PM      Component Value Range Comment   Squamous Epithelial / LPF RARE  RARE    WBC, UA 7-10  <3 WBC/hpf    Bacteria, UA RARE  RARE    Urine-Other MUCOUS PRESENT     MRSA PCR SCREENING     Status: Normal   Collection Time   12/02/11  9:16 PM      Component Value Range Comment   MRSA by PCR NEGATIVE  NEGATIVE   GLUCOSE, CAPILLARY     Status: Abnormal   Collection Time   12/03/11 12:37 AM      Component Value Range Comment   Glucose-Capillary 118 (*) 70 - 99 mg/dL   HEPARIN LEVEL (UNFRACTIONATED)     Status: Normal   Collection Time   12/03/11  1:42 AM      Component Value Range Comment   Heparin Unfractionated 0.44  0.30 - 0.70 IU/mL   CBC     Status: Normal   Collection Time   12/03/11  1:42 AM      Component Value Range Comment   WBC 6.7  4.0 - 10.5 K/uL    RBC 4.21  3.87 - 5.11 MIL/uL    Hemoglobin 13.1  12.0 - 15.0 g/dL    HCT 04.5  40.9 - 81.1 %    MCV 91.4  78.0 - 100.0 fL    MCH 31.1  26.0 - 34.0 pg  MCHC 34.0  30.0 - 36.0 g/dL    RDW 40.9  81.1 - 91.4 %    Platelets 253  150 - 400 K/uL   HEPARIN LEVEL (UNFRACTIONATED)     Status: Abnormal   Collection Time   12/03/11 12:45 PM      Component Value Range Comment   Heparin Unfractionated 0.28 (*) 0.30 - 0.70 IU/mL   HEPARIN LEVEL (UNFRACTIONATED)     Status: Normal   Collection Time   12/03/11  9:25 PM      Component Value Range Comment   Heparin Unfractionated 0.49  0.30 - 0.70 IU/mL   HEPARIN LEVEL (UNFRACTIONATED)     Status: Normal   Collection Time   12/04/11  4:15 AM      Component Value Range Comment   Heparin Unfractionated 0.57  0.30 - 0.70 IU/mL   CBC     Status: Normal   Collection Time   12/04/11  4:15 AM      Component Value Range Comment   WBC 4.7  4.0 - 10.5 K/uL    RBC 4.31  3.87 - 5.11 MIL/uL    Hemoglobin 13.3  12.0 - 15.0 g/dL    HCT 78.2  95.6 - 21.3 %    MCV  92.6  78.0 - 100.0 fL    MCH 30.9  26.0 - 34.0 pg    MCHC 33.3  30.0 - 36.0 g/dL    RDW 08.6  57.8 - 46.9 %    Platelets 211  150 - 400 K/uL   BASIC METABOLIC PANEL     Status: Abnormal   Collection Time   12/04/11  4:15 AM      Component Value Range Comment   Sodium 143  135 - 145 mEq/L    Potassium 4.0  3.5 - 5.1 mEq/L    Chloride 108  96 - 112 mEq/L    CO2 23  19 - 32 mEq/L    Glucose, Bld 100 (*) 70 - 99 mg/dL    BUN 14  6 - 23 mg/dL DELTA CHECK NOTED   Creatinine, Ser 0.79  0.50 - 1.10 mg/dL    Calcium 9.3  8.4 - 62.9 mg/dL    GFR calc non Af Amer >90  >90 mL/min    GFR calc Af Amer >90  >90 mL/min     Disposition:  Follow-up Information    Follow up with Hillis Range, MD on 12/15/2011. (office will call)    Contact information:   [address], [address] [place] [address] [phone]       Follow up with Runell Gess, MD. (office will call)    Contact information:   [address] [place] [address] [phone]          Discharge Medications:  Medication List  As of 12/04/2011  4:11 PM   TAKE these medications         acetaminophen 325 MG tablet   Commonly known as: TYLENOL   Take 2 tablets (650 mg total) by mouth every 4 (four) hours as needed.      aspirin 81 MG EC tablet   Take 162 mg by mouth daily.      fenofibrate 160 MG tablet   Take 160 mg by mouth at bedtime.      metoprolol tartrate 25 MG tablet   Commonly known as: LOPRESSOR   Take 25 mg by mouth 2 (two) times daily.  prednisoLONE acetate 1 % ophthalmic suspension   Commonly known as: PRED FORTE   Place 1 drop into the right eye daily.      Rivaroxaban 20 MG Tabs   Commonly known as: XARELTO   Take 1 tablet (20 mg total) by mouth daily.      rosuvastatin 5 MG tablet   Commonly known as: CRESTOR   Take 5 mg by mouth 3 (three) times a week.      venlafaxine XR 150 MG 24 hr capsule   Commonly known as: EFFEXOR-XR   Take 150 mg by  mouth 2 (two) times daily.             Duration of Discharge Encounter: Greater than 30 minutes including physician time.  Jolene Provost PA-C 12/04/2011 4:11 PM

## 2011-12-04 NOTE — ED Provider Notes (Signed)
I saw and evaluated the patient, reviewed the resident's note and I agree with the findings and plan.  Cheri Guppy, MD 12/04/11 [phone]

## 2011-12-04 NOTE — Progress Notes (Signed)
   ELECTROPHYSIOLOGY NOTE    Patient Name: [name] [name] Date of Encounter: 12-04-2011  Patient scheduled for RFCA of atrial flutter on December 15, 2011 at 7:30AM with Dr Ladona Ridgel.  Written instructions given and reviewed with patient.  Per Dr Jenel Lucks note, we will see as needed rest of this hospitalization.    Signed, Marena Chancy, BSN [phone]

## 2011-12-04 NOTE — Progress Notes (Signed)
Pt received and demonstrated understanding of discharge instructions. Left forearm and wrist IV removed, catheter intact.  Right AC IV removed with catheter intact.  Pt denied pain and ambulated without complaint.  Pt discharged home, alert orientated, vital signs stable.  Pt transported via wheelchair to car.

## 2011-12-07 ENCOUNTER — Encounter (HOSPITAL_COMMUNITY): Payer: Self-pay | Admitting: Cardiovascular Disease

## 2011-12-08 ENCOUNTER — Encounter (HOSPITAL_COMMUNITY): Payer: Self-pay | Admitting: Pharmacy Technician

## 2011-12-15 ENCOUNTER — Encounter (HOSPITAL_COMMUNITY)
Admission: RE | Disposition: A | Discharge status: Home / Self Care | Payer: Self-pay | Source: Ambulatory Visit | Attending: Internal Medicine

## 2011-12-15 ENCOUNTER — Encounter (HOSPITAL_COMMUNITY): Payer: Self-pay | Admitting: *Deleted

## 2011-12-15 ENCOUNTER — Ambulatory Visit (HOSPITAL_COMMUNITY)
Admission: RE | Admit: 2011-12-15 | Discharge: 2011-12-16 | Disposition: A | Discharge status: Home / Self Care | Payer: BC Managed Care – PPO | Source: Ambulatory Visit | Attending: Internal Medicine | Admitting: Internal Medicine

## 2011-12-15 DIAGNOSIS — E785 Hyperlipidemia, unspecified: Secondary | ICD-10-CM | POA: Insufficient documentation

## 2011-12-15 DIAGNOSIS — I4892 Unspecified atrial flutter: Secondary | ICD-10-CM

## 2011-12-15 HISTORY — PX: ATRIAL FLUTTER ABLATION: SHX5733

## 2011-12-15 SURGERY — ATRIAL FLUTTER ABLATION
Anesthesia: LOCAL

## 2011-12-15 MED ORDER — HEPARIN (PORCINE) IN NACL 2-0.9 UNIT/ML-% IJ SOLN
INTRAMUSCULAR | Status: AC
  Filled 2011-12-15: qty 1000

## 2011-12-15 MED ORDER — ACETAMINOPHEN 325 MG PO TABS
650.0000 mg | ORAL_TABLET | ORAL | Status: DC | PRN
  Administered 2011-12-15 (×2): 650 mg via ORAL
  Filled 2011-12-15 (×2): qty 2

## 2011-12-15 MED ORDER — PREDNISOLONE ACETATE 1 % OP SUSP
1.0000 [drp] | Freq: Every day | OPHTHALMIC | Status: DC
  Filled 2011-12-15: qty 1

## 2011-12-15 MED ORDER — RIVAROXABAN 20 MG PO TABS
20.0000 mg | ORAL_TABLET | Freq: Every day | ORAL | Status: DC
  Administered 2011-12-15: 20 mg via ORAL
  Filled 2011-12-15 (×2): qty 1

## 2011-12-15 MED ORDER — BUPIVACAINE HCL (PF) 0.25 % IJ SOLN
INTRAMUSCULAR | Status: AC
  Filled 2011-12-15: qty 60

## 2011-12-15 MED ORDER — MIDAZOLAM HCL 5 MG/5ML IJ SOLN
INTRAMUSCULAR | Status: AC
  Filled 2011-12-15: qty 5

## 2011-12-15 MED ORDER — SODIUM CHLORIDE 0.9 % IJ SOLN
3.0000 mL | Freq: Two times a day (BID) | INTRAMUSCULAR | Status: DC
  Administered 2011-12-15 – 2011-12-16 (×2): 3 mL via INTRAVENOUS

## 2011-12-15 MED ORDER — ONDANSETRON HCL 4 MG/2ML IJ SOLN: 4.0000 mg | Freq: Four times a day (QID) | INTRAMUSCULAR | Status: DC | PRN

## 2011-12-15 MED ORDER — SODIUM CHLORIDE 0.9 % IV SOLN: 250.0000 mL | INTRAVENOUS | Status: DC | PRN

## 2011-12-15 MED ORDER — FENTANYL CITRATE 0.05 MG/ML IJ SOLN
INTRAMUSCULAR | Status: AC
  Filled 2011-12-15: qty 2

## 2011-12-15 MED ORDER — VENLAFAXINE HCL ER 150 MG PO CP24
150.0000 mg | ORAL_CAPSULE | Freq: Two times a day (BID) | ORAL | Status: DC
  Administered 2011-12-15 – 2011-12-16 (×2): 150 mg via ORAL
  Filled 2011-12-15 (×3): qty 1

## 2011-12-15 MED ORDER — SODIUM CHLORIDE 0.9 % IJ SOLN: 3.0000 mL | INTRAMUSCULAR | Status: DC | PRN

## 2011-12-15 MED ORDER — METOPROLOL TARTRATE 25 MG PO TABS
25.0000 mg | ORAL_TABLET | Freq: Two times a day (BID) | ORAL | Status: DC
  Administered 2011-12-15 – 2011-12-16 (×3): 25 mg via ORAL
  Filled 2011-12-15 (×5): qty 1

## 2011-12-15 MED ORDER — FENOFIBRATE 160 MG PO TABS
160.0000 mg | ORAL_TABLET | Freq: Every day | ORAL | Status: DC
  Administered 2011-12-15: 160 mg via ORAL
  Filled 2011-12-15 (×2): qty 1

## 2011-12-15 MED ORDER — SODIUM CHLORIDE 0.9 % IV SOLN
INTRAVENOUS | Status: DC
  Administered 2011-12-15: 07:00:00 via INTRAVENOUS

## 2011-12-15 NOTE — Interval H&P Note (Signed)
History and Physical Interval Note: The patient has been stable in the interval since hospitalization and discharge. I have discussed the indications, risks, benefits, goals and expectations of catheter ablation of atrial flutter and she wishes to proceed.  12/15/2011 6:59 AM  [name] [name]  has presented today for surgery, with the diagnosis of aflutter  The various methods of treatment have been discussed with the patient and family. After consideration of risks, benefits and other options for treatment, the patient has consented to  Procedure(s) (LRB): ATRIAL FLUTTER ABLATION (N/A) as a surgical intervention .  The patient's history has been reviewed, patient examined, no change in status, stable for surgery.  I have reviewed the patient's chart and labs.  Questions were answered to the patient's satisfaction.     Lewayne Bunting

## 2011-12-15 NOTE — Research (Signed)
Block CTIInformed Consent   Subject Name: [name] [name]  Subject met inclusion and exclusion criteria.  The informed consent form, study requirements and expectations were reviewed with the subject and questions and concerns were addressed prior to the signing of the consent form.  The subject verbalized understanding of the trail requirements.  The subject agreed to participate in the Block CTI trial and signed the informed consent.  The informed consent was obtained prior to performance of any protocol-specific procedures for the subject.  A copy of the signed informed consent was given to the subject and a copy was placed in the subject's medical record.  Loletha Carrow 12/15/2011, 6:29am

## 2011-12-15 NOTE — H&P (View-Only) (Signed)
  ELECTROPHYSIOLOGY CONSULT NOTE    Patient ID: [name] [name] MRN: [id number], DOB/AGE: 01/17/1961 51 y.o.  Admit date: 12/02/2011 Date of Consult: 12-03-2011  Primary Physician: DEWEY,ELIZABETH, MD Primary Cardiologist: Kenneth Hilty, MD  Reason for Consultation: atrial flutter  HPI:  [name] [name] is a 51 year old female with recurrent atrial flutter.  EP has been asked to evaluate for treatment options.  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  She was first diagnosed with atrial flutter in May of this year.  At that time, she presented to the ER after being symptomatic with tachy palpitations and dizziness.  She was found to be in rapid atrial flutter and was given one dose of Flecainide.  She converted and was discharged to home.  The next morning, she had return of her her symptoms and came back to the hospital for further evaluation.  She was admitted and placed on IV Diltiazem for rate control.  She underwent TEE guided DCCV and was discharged to home on Xarelto and Metoprolol.  She took Xarelto for 30 days post DCCV and then discontinued as instructed.  She has continued taking Metoprolol.   She did well until this past weekend when she again noticed fatigue and tachypalpitations (although not as severe as before).  She presented to the hospital later in the week and was again found to be in atrial flutter with RVR.  She was placed on IV Heparin, Diltiazem, and Digoxin.  Plans are for TEE/DCCV tomorrow morning.  Her previous cardiac workup is notable for a TEE in May which demonstrated a normal EF and normal LA size (however, no measurement is given). According to hospital notes in May, there were plans for an outpatient myoview, but this has not yet been done.   She denies chest pain, shortness of breath,syncope, dizziness, fatigue, exercise intolerance while in sinus rhythm.  She is  symptomatic with atrial flutter with shortness of breath and dizziness.    We spoke with [facility] regarding her recent surgery for retinal detachment.   They are advising waiting to ablate the patient until the week of August 12th because of restrictions on lying flat post retinal surgery.   Past Medical History  Diagnosis Date  . High cholesterol   . Atrial flutter with rapid ventricular response 09/08/2011  . Hyperlipemia 09/08/2011  . Detached retina      Surgical History:  Past Surgical History  Procedure Date  . Appendectomy   . Tonsillectomy   . Tubal ligation   . Tee without cardioversion 09/09/2011    Procedure: TRANSESOPHAGEAL ECHOCARDIOGRAM (TEE);  Surgeon: Mihai Croitoru, MD;  Location: [facility] ENDOSCOPY;  Service: Cardiovascular;  Laterality: N/A;  anes. not able to come fo cardioversion / pt TBA but in ER     Prescriptions prior to admission  Medication Sig Dispense Refill  . aspirin 81 MG EC tablet Take 162 mg by mouth daily.      . fenofibrate 160 MG tablet Take 160 mg by mouth at bedtime.       . metoprolol tartrate (LOPRESSOR) 25 MG tablet Take 25 mg by mouth 2 (two) times daily.      . prednisoLONE acetate (PRED FORTE) 1 % ophthalmic suspension Place 1 drop into the right eye daily.      . rosuvastatin (CRESTOR) 5 MG tablet Take 5 mg by mouth 3 (three) times a week.      .   venlafaxine XR (EFFEXOR-XR) 150 MG 24 hr capsule Take 150 mg by mouth 2 (two) times daily.        Inpatient Medications:    . aspirin EC  162 mg Oral Daily  . atorvastatin  10 mg Oral q1800  . digoxin  0.25 mg Oral Once  . diltiazem (CARDIZEM) infusion  5-15 mg/hr Intravenous Once  . fenofibrate  160 mg Oral QHS  . heparin  4,000 Units Intravenous Once  . metoprolol tartrate  25 mg Oral BID  . prednisoLONE acetate  1 drop Right Eye Daily  . venlafaxine XR  150 mg Oral BID   Allergies:  Allergies  Allergen Reactions  . Wellbutrin (Bupropion) Anaphylaxis  . Statins Other (See  Comments)    Myalgias with Crestor and Lipitor 8 yrs ago   History   Social History  . Marital Status: Divorced    Spouse Name: N/A    Number of Children: N/A  . Years of Education: N/A   Occupational History  . Not on file.   Social History Main Topics  . Smoking status: Former Smoker    Quit date: 05/04/2001  . Smokeless tobacco: Never Used  . Alcohol Use: 1.2 oz/week    2 Cans of beer per week  . Drug Use: No  . Sexually Active:    Other Topics Concern  . Not on file   Social History Narrative  . No narrative on file    Family History  Problem Relation Age of Onset  . Stroke Mother   . Hypertension Mother   . Coronary artery disease Father   . Coronary artery disease Brother     Physical Exam: Filed Vitals:   12/03/11 1700 12/03/11 1800 12/03/11 1900 12/03/11 2000  BP: 109/59 93/61 115/69 95/46  Pulse: 144 141 134 147  Temp:      TempSrc:      Resp: 25 21 26 19  Height:      Weight:      SpO2: 96% 96% 96% 95%    GEN- The patient is well appearing, alert and oriented x 3 today.   Head- normocephalic, atraumatic Eyes-  Sclera clear, conjunctiva pink Ears- hearing intact Oropharynx- clear Neck- supple, no JVP Lymph- no cervical lymphadenopathy Lungs- Clear to ausculation bilaterally, normal work of breathing Heart- tachy irregular rhythm, no murmurs, rubs or gallops, PMI not laterally displaced GI- soft, NT, ND, + BS Extremities- no clubbing, cyanosis, or edema MS- no significant deformity or atrophy Skin- no rash or lesion Psych- euthymic mood, full affect Neuro- strength and sensation are intact    Labs:   Lab Results  Component Value Date   WBC 6.7 12/03/2011   HGB 13.1 12/03/2011   HCT 38.5 12/03/2011   MCV 91.4 12/03/2011   PLT 253 12/03/2011    Lab 12/02/11 1840  NA 141  K 3.8  CL 101  CO2 28  BUN 27*  CREATININE 0.86  CALCIUM 10.3  PROT 7.3  BILITOT 0.2*  ALKPHOS 61  ALT 21  AST 19  GLUCOSE 117*    EKG:atrial flutter with  RVR  TELEMETRY: atrial flutter with controlled ventricular response  Assessment/Impression/Plan:  1. Typical atrial flutter The patient has symptomatic recurrent typical appearing atrial flutter.  Heart rates are presently quite elevated.  She is on a heparin gtt at this time and plans for TEE guided cardioversion are noted.  She will require adequate anticoagulation following cardioversion, likely coumadin with lovenox bridge.  I will defer this   decision to her primary team. EP has been asked to assess for possible ablation.  She is a good candidate for ablation. Risk, benefits, and alternatives to EP study and radiofrequency ablation for atrial flutter were also discussed in detail today. These risks include but are not limited to stroke, bleeding, vascular damage, tamponade, perforation, damage to the heart and other structures, AV block requiring pacemaker, worsening renal function, and death. The patient understands these risk and wishes to proceed.   She is scheduled for ablation with Dr Taylor on August 13th.  She would be a good candidate for the Block CTI trial.  We will try to have our research team discuss this with her in the am.  She should continue therapeutic anticoagulation until after her ablation (4 weeks from tomorrow's cardioversion).  We will see as needed while here. Please call with questions.  Bryceson Grape,MD     

## 2011-12-15 NOTE — Op Note (Signed)
EPA/RFA of atrial flutter without immediate complication. W#[id number].

## 2011-12-16 ENCOUNTER — Telehealth: Payer: Self-pay

## 2011-12-16 DIAGNOSIS — I4892 Unspecified atrial flutter: Secondary | ICD-10-CM

## 2011-12-16 MED ORDER — RIVAROXABAN 20 MG PO TABS: 20.0000 mg | ORAL_TABLET | Freq: Every day | ORAL | Status: DC

## 2011-12-16 NOTE — Discharge Summary (Signed)
   ELECTROPHYSIOLOGY DISCHARGE SUMMARY    Patient ID: [name] [name],  MRN: [id number], DOB/AGE: [birth year] 51 y.o.  Admit date: 12/15/2011 Discharge date: 12/16/2011  Primary Care Physician: Maryelizabeth Rowan, MD Primary Cardiologist: Zoila Shutter, MD  Primary Discharge Diagnosis:  1. Atrial flutter s/p RF ablation  Secondary Discharge Diagnoses:  1. Dyslipidemia 2. Detached retina s/p recent repair  Procedures This Admission:  1. EP study +RF ablation of atrial flutter without immediate complication (please see Dr. Lubertha Basque procedure report for full details)  History and Hospital Course:  [name] [name] is a 51 year old woman with dyslipidemia and atrial flutter who was referred by Dr. Rennis Golden for EP evaluation and recommendations. She elected to proceed with EP study +RF ablation. This was performed yesterday 12/15/2011. The patient tolerated this procedure well without any immediate complication. She remains hemodynamically stable and afebrile. Her groin site is intact without significant bleeding or hematoma. She has been given discharge instructions including wound care and activity restrictions. She will follow-up in clinic in 6 weeks. She has been seen, examined and deemed stable for discharge today by Dr. Lewayne Bunting.  Discharge Vitals: Blood pressure 102/68, pulse 77, temperature 98.3 F (36.8 C), temperature source Oral, resp. rate 18, height 5\' 6"  (1.676 m), weight 210 lb (95.255 kg), SpO2 94.00%.   Labs: Lab Results  Component Value Date   WBC 4.7 12/04/2011   HGB 13.3 12/04/2011   HCT 39.9 12/04/2011   MCV 92.6 12/04/2011   PLT 211 12/04/2011   No results found for this basename: NA,K,CL,CO2,BUN,CREATININE,CALCIUM,LABALBU,PROT,BILITOT,ALKPHOS,ALT,AST,GLUCOSE in the last 168 hours  Disposition:  The patient is being discharged in stable condition.  Follow-up: Follow-up Information    Follow up with Lewayne Bunting, MD on 02/01/2012. (At 2:15 PM)    Contact information:   [address] [place] [address] [phone]   Discharge Medications:  Medication List  As of 12/16/2011  8:29 AM   TAKE these medications         acetaminophen 325 MG tablet   Commonly known as: TYLENOL   Take 650 mg by mouth every 4 (four) hours as needed. PAIN FROM PROCEDURE      aspirin 81 MG EC tablet   Take 162 mg by mouth daily.      fenofibrate 160 MG tablet   Take 160 mg by mouth at bedtime.      metoprolol tartrate 25 MG tablet   Commonly known as: LOPRESSOR   Take 25 mg by mouth 2 (two) times daily.      prednisoLONE acetate 1 % ophthalmic suspension   Commonly known as: PRED FORTE   Place 1 drop into the right eye daily.      Rivaroxaban 20 MG Tabs   Commonly known as: XARELTO   Take 1 tablet (20 mg total) by mouth daily.      rosuvastatin 5 MG tablet   Commonly known as: CRESTOR   Take 5 mg by mouth 3 (three) times a week. Takes on Sunday, Wednesday, and Friday      venlafaxine XR 150 MG 24 hr capsule   Commonly known as: EFFEXOR-XR   Take 150 mg by mouth 2 (two) times daily.          Duration of Discharge Encounter: Greater than 30 minutes including physician time.  Signed, Rick Duff, PA-C 12/16/2011, 8:29 AM

## 2011-12-16 NOTE — Telephone Encounter (Signed)
Patient's son came to office to pick up xarelto 20 mg samples.

## 2011-12-16 NOTE — Progress Notes (Signed)
Patient ID: [name] [name], female   DOB: 1960-07-16, 51 y.o.   MRN: [id number] Subjective:  S/p EPS/RFA of atrial flutter. C/o minimal difficulty taking a deep breath.  Objective:  Vital Signs in the last 24 hours: Temp:  [98.3 F (36.8 C)] 98.3 F (36.8 C) (08/14 0500) Pulse Rate:  [77-78] 77  (08/14 0500) Resp:  [18] 18  (08/14 0500) BP: (102-133)/(66-81) 102/68 mmHg (08/14 0500) SpO2:  [94 %-97 %] 94 % (08/14 0500)  Intake/Output from previous day:   Intake/Output from this shift: Total I/O In: 480 [P.O.:480] Out: -   Physical Exam: Well appearing middle age woman, NAD HEENT: Unremarkable Neck:  No JVD, no thyromegally Lungs:  Clear with no wheezes, rales or rhonchi HEART:  Regular rate rhythm, no murmurs, no rubs, no clicks Abd:  soft, positive bowel sounds, no organomegally, no rebound, no guarding Ext:  2 plus pulses, no edema, no cyanosis, no clubbing Skin:  No rashes no nodules Neuro:  CN II through XII intact, motor grossly intact  Lab Results: No results found for this basename: WBC:2,HGB:2,PLT:2 in the last 72 hours No results found for this basename: NA:2,K:2,CL:2,CO2:2,GLUCOSE:2,BUN:2,CREATININE:2 in the last 72 hours No results found for this basename: TROPONINI:2,CK,MB:2 in the last 72 hours Hepatic Function Panel No results found for this basename: PROT,ALBUMIN,AST,ALT,ALKPHOS,BILITOT,BILIDIR,IBILI in the last 72 hours No results found for this basename: CHOL in the last 72 hours No results found for this basename: PROTIME in the last 72 hours  Imaging: No results found.  Cardiac Studies: Tele - NSR Assessment/Plan:  1. Atrial flutter 2. S/p catheter ablation 3. HTN Rec: ok for discharge today. followup with me in 4-6 weeks. Continue current meds including Xarelto.  LOS: 1 day    Buel Ream.D. 12/16/2011, 8:00 AM

## 2011-12-16 NOTE — Care Management Note (Signed)
    Page 1 of 1   12/16/2011     10:42:41 AM   CARE MANAGEMENT NOTE 12/16/2011  Patient:  [name] [name], [name] [name]   Account Number:  [id number]  Date Initiated:  12/16/2011  Documentation initiated by:  GRAVES-BIGELOW,Chasity Outten  Subjective/Objective Assessment:   Pt admitted for an ablation. Plan for d/c home with xarelto. CM did contact insurance and co pay will be $272.40 for a 30 day supply. No prior auth required.     Action/Plan:   CM did call xarelto to see if pt is eligible for savings card discount. CM did make PA Brooke aware of cost for xarelto and she was going to check the office for samples for pt. No further needs for Cm at this time.   Anticipated DC Date:  12/16/2011   Anticipated DC Plan:  HOME/SELF CARE         Choice offered to / List presented to:             Status of service:  Completed, signed off Medicare Important Message given?   (If response is "NO", the following Medicare IM given date fields will be blank) Date Medicare IM given:   Date Additional Medicare IM given:    Discharge Disposition:  HOME/SELF CARE  Per UR Regulation:  Reviewed for med. necessity/level of care/duration of stay  If discussed at Long Length of Stay Meetings, dates discussed:    Comments:

## 2011-12-16 NOTE — Op Note (Signed)
NAMECHLORIS, MARCOUX NO.:  [id number]  MEDICAL RECORD NO.:  [id number]  LOCATION:  [id number]                        FACILITY:  [facility]  PHYSICIAN:  Doylene Canning. Ladona Ridgel, MD    DATE OF BIRTH:  [birth year]  DATE OF PROCEDURE:  12/15/2011 DATE OF DISCHARGE:                              OPERATIVE REPORT   PROCEDURE PERFORMED:  Electrophysiologic study and RF catheter ablation of atrial flutter.  INTRODUCTION:  The patient is a 51 year old woman with a history of symptomatic tachy palpitations and documented atrial flutter.  She underwent TEE guided cardioversion several weeks ago, and presents now for catheter ablation.  PROCEDURE:  After informed consent was obtained, the patient was taken to the diagnostic EP lab in a fasting state.  After usual preparation and draping, intravenous fentanyl and midazolam was given for sedation. A 6-French octapolar catheter was inserted percutaneously in the right femoral vein and advanced to the coronary sinus.  A 6-French quadripolar catheter was inserted percutaneously in the right femoral vein and advanced to the His bundle region.  After measurement of the basic intervals, rapid pacing was carried out from the right atrium demonstrating an AV Wenckebach cycle length of 320 msec.  Programed atrial stimulation was carried out from the coronary sinus at a base drive cycle length of 161 msec and stepwise decreased down to 290 msec where AV node ERP was observed.  During programed atrial stimulation, there were no AH jumps, no echo beats.  Next, rapid atrial pacing was carried out down to 210 msec resulting in the initiation of atrial flutter.  This was typical counterclockwise tricuspid annular reentrant atrial flutter.  Cycle length was 240 milliseconds.  Mapping was carried out demonstrating a counterclockwise activation around the tricuspid valve anulus.  The CS activation was earliest proximal to distal.  A 7- [place] quadripolar  ablation catheter was then inserted percutaneously into the right femoral vein and advanced into the tricuspid valve anulus.  Mapping was carried out of the atrial flutter isthmus.  It was much larger than usual.  The electrograms were large as well.  A total of 21 RF energy applications were delivered to the atrial flutter isthmus resulting in termination of atrial flutter, restoration of sinus rhythm, and creation of bidirectional block and atrial flutter isthmus. The stim-A interval was between 140 and 150 milliseconds following at the end of the case.  Rapid ventricular pacing was carried out demonstrating a VA Wenckebach cycle length of 400 msec and programed ventricular stimulation was carried out from the right ventricle at a base drive cycle length of 096 msec with the S1-S2 interval stepwise decreased down to 320 msec where ventricular refractoriness was observed.  During programed ventricular stimulation, the atrial activation was midline and decremental.  At this point, the catheters were removed, hemostasis was assured, and the patient was returned to her room in satisfactory condition.  COMPLICATIONS:  There were no immediate procedure complications.  RESULTS:  A.  Baseline ECG:  Baseline ECG demonstrates sinus rhythm with normal axis and intervals. B.  Baseline intervals.  Sinus node cycle length was 843 msec.  The HV interval was 38 msec.  The AH interval was 95 milliseconds.  C.  Rapid ventricular pacing.  Rapid ventricular pacing following ablation demonstrated VA Wenckebach cycle length of 400 msec. D.  Programed ventricular stimulation.  Programed ventricular stimulation was carried out from the right ventricle at a base drive cycle length of 161 msec.  The S1-S2 interval was stepwise decreased down to 320 msec where ventricular refractoriness was observed.  During programed ventricular stimulation, the atrial activation was midline and decremental. E.  Rapid atrial  pacing.  Rapid atrial pacing was carried out from the right atrium and the left atrium at a base drive cycle length of 096 msec and stepwise decreased down to 320 msec where AV Wenckebach was observed.  Additional decrements down to 210 msec demonstrated the initiation of atrial flutter. F.  Programed atrial stimulation.  Programed atrial stimulation was carried out from the right atrium and coronary sinus at a base drive cycle length of 045 msec.  The S1-S2 interval was stepwise decreased down to 290 msec where the atrial refractoriness was observed.  During programed atrial stimulation, there were no AH jumps, no echo beats, no inducible SVT. G.  Arrhythmias observed. 1. Atrial flutter initiation was with rapid atrial pacing.  Duration     was sustained, cycle length 240 milliseconds.  Method of     termination was with catheter ablation.     a.     Mapping.  Mapping of the atrial flutter isthmus demonstrated      an unusually large atrial flutter isthmus.     b.     RF energy application.  A total of 21 RF energy applications      were delivered to the atrial flutter isthmus.  This resulted in      termination of flutter, restoration of sinus rhythm, and creation      of bidirectional block and atrial flutter isthmus.  CONCLUSIONS:  The study demonstrates successful electrophysiologic study with RF catheter ablation of typical atrial flutter with 21 RF energy applications delivered to a very large and thick atrial flutter isthmus.     Doylene Canning. Ladona Ridgel, MD     GWT/MEDQ  D:  12/15/2011  T:  12/16/2011  Job:  [id number]

## 2011-12-31 DIAGNOSIS — H3320 Serous retinal detachment, unspecified eye: Secondary | ICD-10-CM | POA: Insufficient documentation

## 2012-01-03 DEATH — deceased

## 2012-02-01 ENCOUNTER — Ambulatory Visit (INDEPENDENT_AMBULATORY_CARE_PROVIDER_SITE_OTHER): Payer: BC Managed Care – PPO | Admitting: Internal Medicine

## 2012-02-01 ENCOUNTER — Encounter: Payer: Self-pay | Admitting: Internal Medicine

## 2012-02-01 VITALS — BP 130/78 | HR 85 | Ht 66.0 in | Wt 220.8 lb

## 2012-02-01 DIAGNOSIS — Z7901 Long term (current) use of anticoagulants: Secondary | ICD-10-CM

## 2012-02-01 DIAGNOSIS — I4892 Unspecified atrial flutter: Secondary | ICD-10-CM

## 2012-02-01 DIAGNOSIS — R0789 Other chest pain: Secondary | ICD-10-CM

## 2012-02-01 NOTE — Assessment & Plan Note (Signed)
The patient has had exertional chest pressure. She is scheduled to undergo stress testing by her primary cardiologist.

## 2012-02-01 NOTE — Assessment & Plan Note (Signed)
She is now 6 weeks out from her ablation and maintaining sinus rhythm. I've recommended that she stop taking her anticoagulation. She will take a baby aspirin.

## 2012-02-01 NOTE — Patient Instructions (Addendum)
Your physician has recommended you make the following change in your medication:  1.) STOP XARELTO 2.)  METOPROLOL   Your physician wants you to follow-up AS NEEDED. CALL us IF YOU NEED [place] [phone]

## 2012-02-01 NOTE — Progress Notes (Signed)
HPI Ms. Shackelton returns today for followup. She is a very pleasant 51 year old woman with a history of atrial flutter, who underwent catheter ablation approximate 6 weeks ago. In the interim, she has had no additional symptomatic arrhythmias. She remains on her beta blocker and her anticoagulation. Over the last several weeks, she has had increased fatigue, shortness of breath, and chest pressure. This typically occurs with exertion. She has not had frank syncope. No palpitations. No peripheral edema. Allergies  Allergen Reactions  . Wellbutrin (Bupropion) Anaphylaxis  . Statins Other (See Comments)    Myalgias with Crestor and Lipitor 8 yrs ago; MUSCLE ACHES     Current Outpatient Prescriptions  Medication Sig Dispense Refill  . DULoxetine (CYMBALTA) 60 MG capsule Take 60 mg by mouth daily.      . fenofibrate 160 MG tablet Take 160 mg by mouth at bedtime.       . metoprolol tartrate (LOPRESSOR) 25 MG tablet Take 25 mg by mouth 2 (two) times daily.      . prednisoLONE acetate (PRED FORTE) 1 % ophthalmic suspension Place 1 drop into the right eye daily.      . Rivaroxaban (XARELTO) 20 MG TABS Take 1 tablet (20 mg total) by mouth daily with supper.  30 tablet  3  . rosuvastatin (CRESTOR) 5 MG tablet Take 5 mg by mouth 3 (three) times a week. Takes on Sunday, Wednesday, and Friday         Past Medical History  Diagnosis Date  . High cholesterol   . Atrial flutter with rapid ventricular response 09/08/2011  . Hyperlipemia 09/08/2011  . Detached retina     ROS:   All systems reviewed and negative except as noted in the HPI.   Past Surgical History  Procedure Date  . Appendectomy   . Tonsillectomy   . Tubal ligation   . Tee without cardioversion 09/09/2011    Procedure: TRANSESOPHAGEAL ECHOCARDIOGRAM (TEE);  Surgeon: Thurmon Fair, MD;  Location: [facility] ENDOSCOPY;  Service: Cardiovascular;  Laterality: N/A;  anes. not able to come fo cardioversion / pt TBA but in ER  . Tee without  cardioversion 12/04/2011    Procedure: TRANSESOPHAGEAL ECHOCARDIOGRAM (TEE);  Surgeon: Thurmon Fair, MD;  Location: [facility] ENDOSCOPY;  Service: Cardiovascular;  Laterality: N/A;  . Cardioversion 12/04/2011    Procedure: CARDIOVERSION;  Surgeon: Thurmon Fair, MD;  Location: [facility] ENDOSCOPY;  Service: Cardiovascular;  Laterality: N/A;     Family History  Problem Relation Age of Onset  . Stroke Mother   . Hypertension Mother   . Coronary artery disease Father   . Coronary artery disease Brother      History   Social History  . Marital Status: Single    Spouse Name: N/A    Number of Children: N/A  . Years of Education: N/A   Occupational History  . Not on file.   Social History Main Topics  . Smoking status: Former Smoker    Quit date: 05/04/2001  . Smokeless tobacco: Never Used  . Alcohol Use: 1.2 oz/week    2 Cans of beer per week  . Drug Use: No  . Sexually Active:    Other Topics Concern  . Not on file   Social History Narrative  . No narrative on file     BP 130/78  Pulse 85  Ht 5\' 6"  (1.676 m)  Wt 220 lb 12.8 oz (100.154 kg)  BMI 35.64 kg/m2  SpO2 99%  Physical Exam:  Well appearing obese, middle-age woman,  NAD HEENT: Unremarkable Neck:  No JVD, no thyromegally Lungs:  Clear with no wheezes, rales, or rhonchi. HEART:  Regular rate rhythm, no murmurs, no rubs, no clicks Abd:  soft, positive bowel sounds, no organomegally, no rebound, no guarding Ext:  2 plus pulses, no edema, no cyanosis, no clubbing Skin:  No rashes no nodules Neuro:  CN II through XII intact, motor grossly intact  EKG Normal sinus rhythm.  Assess/Plan:

## 2012-02-01 NOTE — Assessment & Plan Note (Signed)
She has had no recurrent symptomatic atrial flutter since undergoing catheter ablation. She will undergo watchful waiting.

## 2012-02-04 IMAGING — US US SOFT TISSUE HEAD/NECK
1 series · 14 of 25 positions shown · non-contrast
Comparison: None.

CLINICAL DATA: Enlarged thyroid.

THYROID ULTRASOUND
TECHNIQUE: Ultrasound examination of the thyroid gland and adjacent
soft tissues was performed.

[Series 1: us soft tissue head/neck · 0.07mm/px · 14 of 34 slices shown]
[im 1/34]
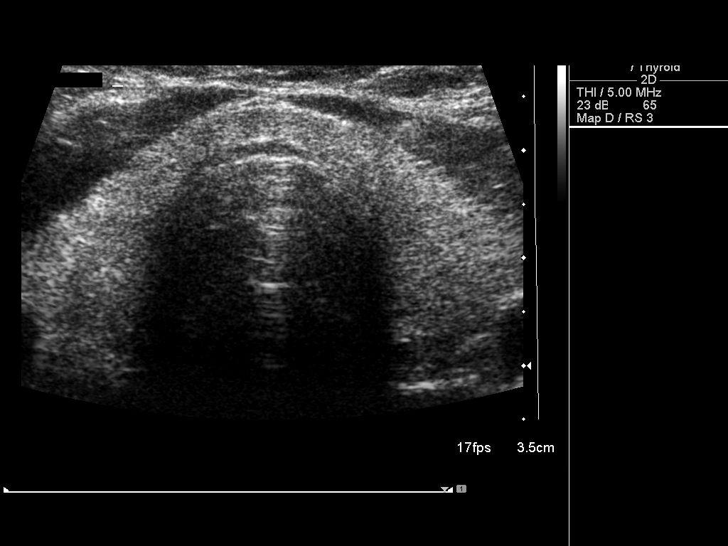
[im 3/34]
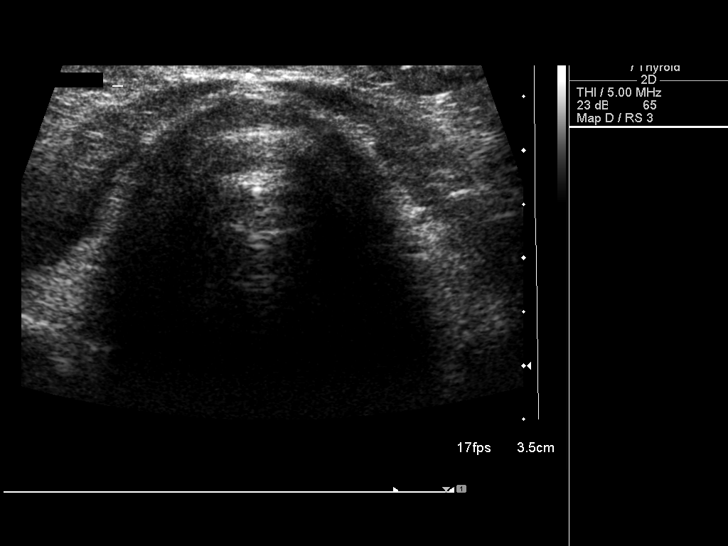
[im 6/34]
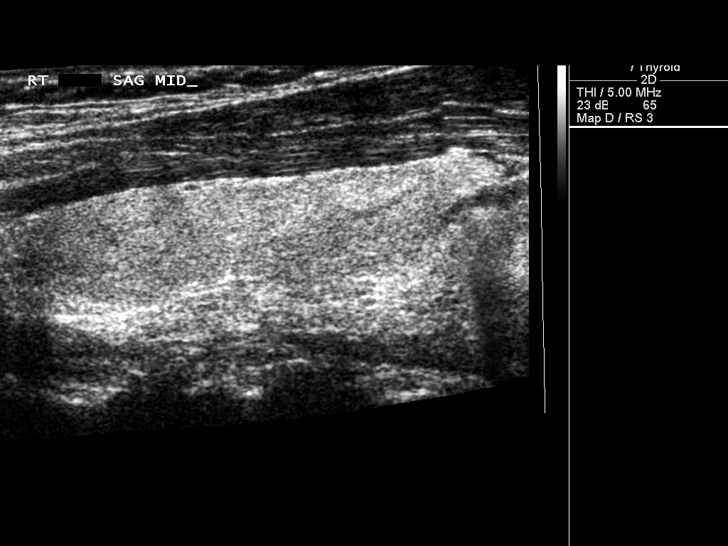
[im 9/34]
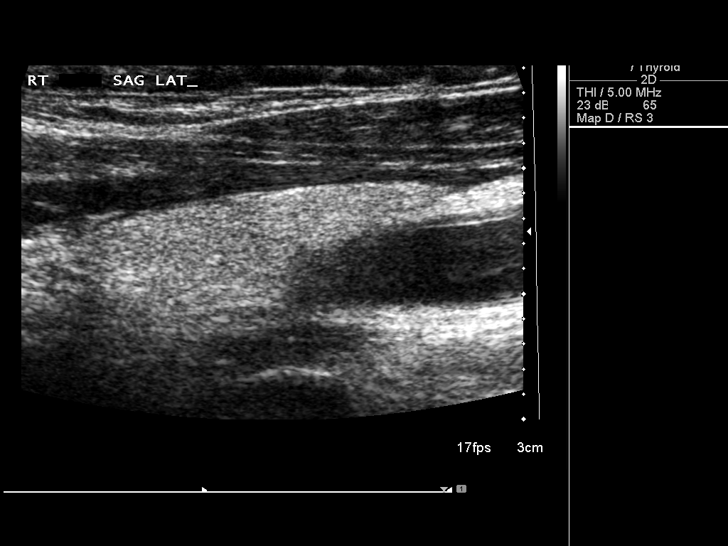
[im 12/34]
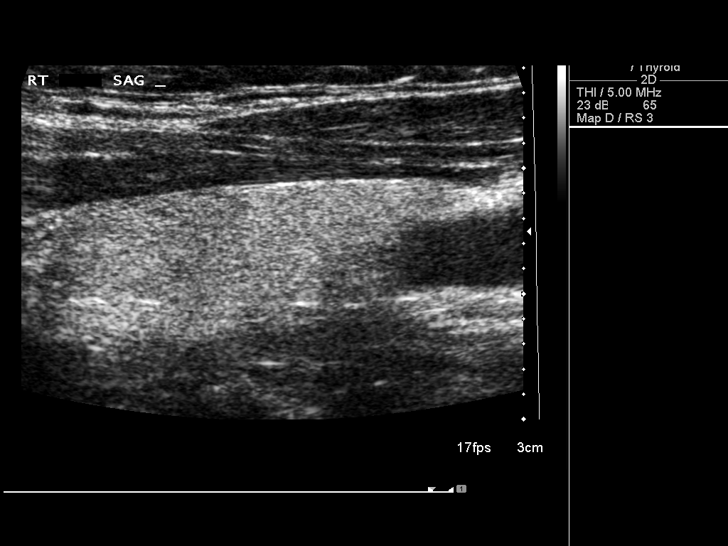
[im 13/34]
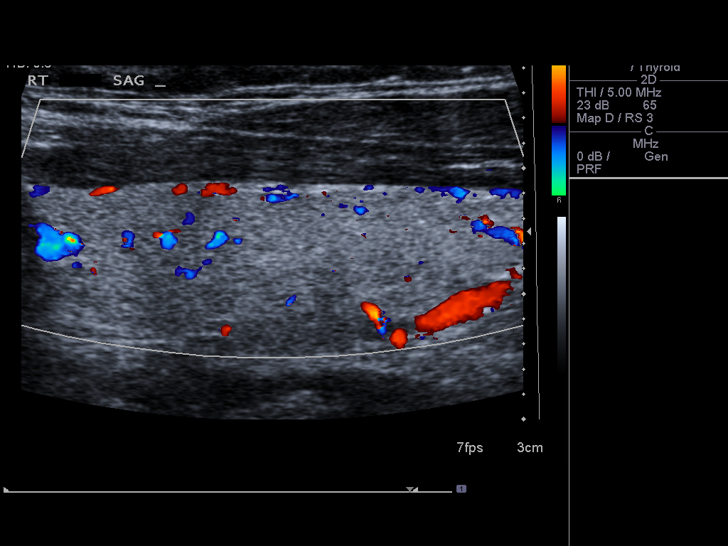
[im 16/34]
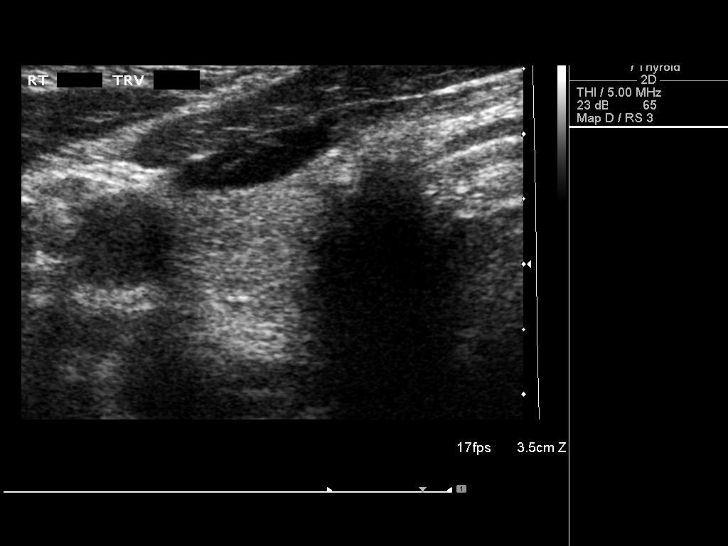
[im 18/34]
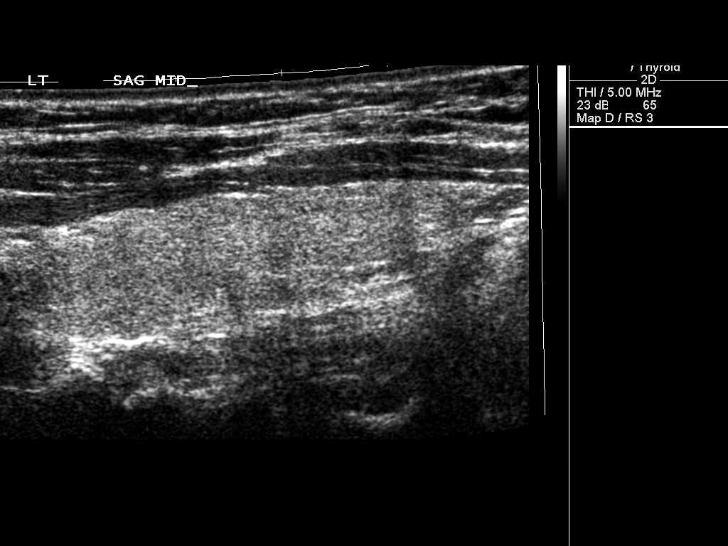
[im 21/34]
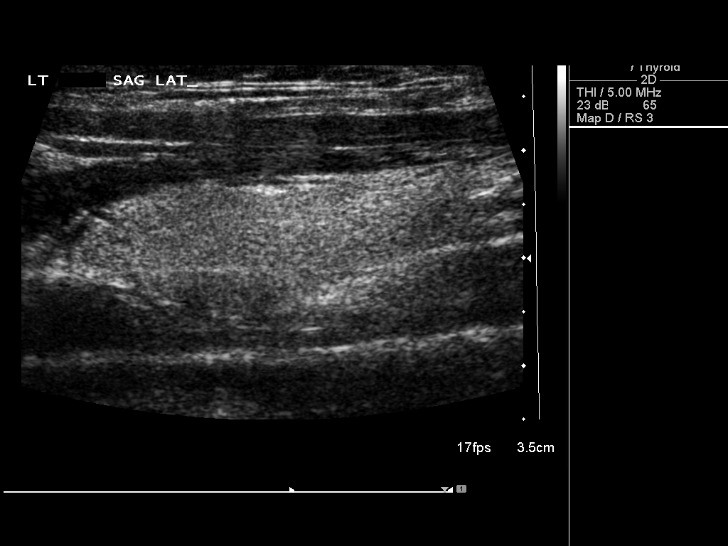
[im 23/34]
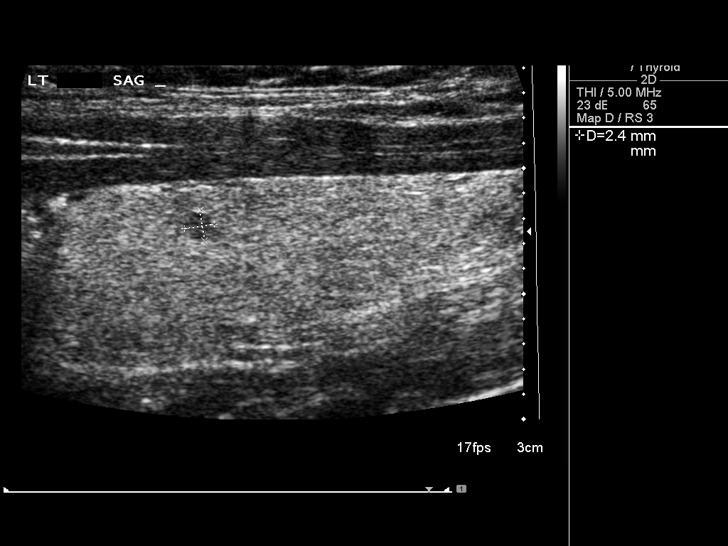
[im 25/34]
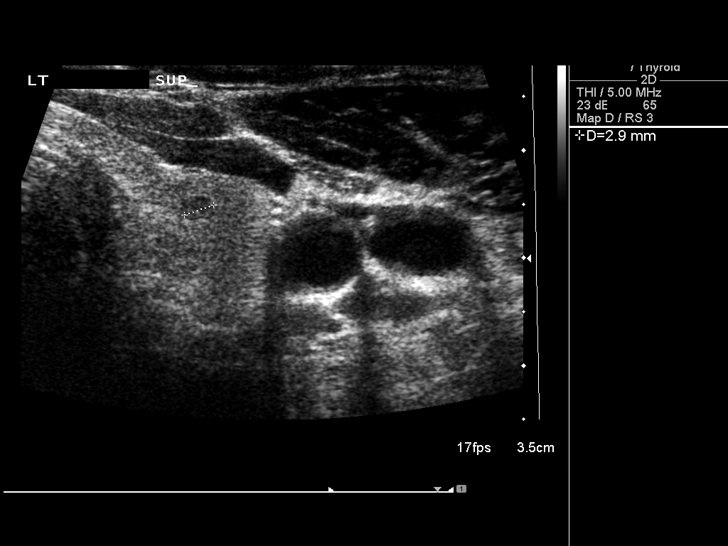
[im 28/34]
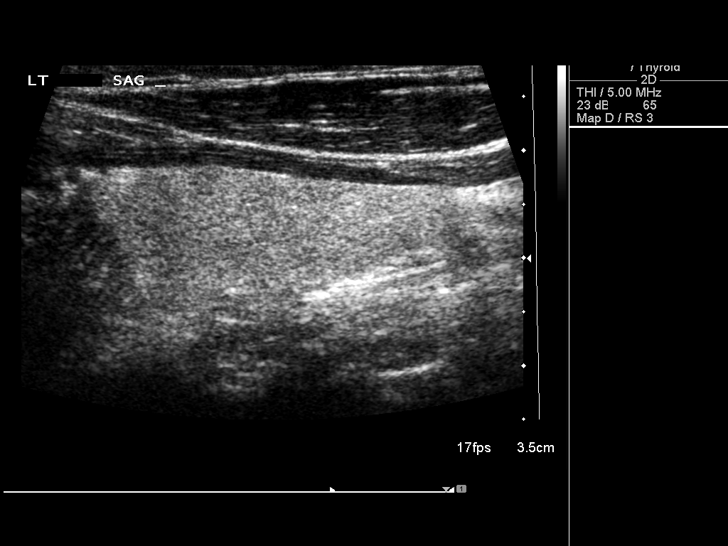
[im 31/34]
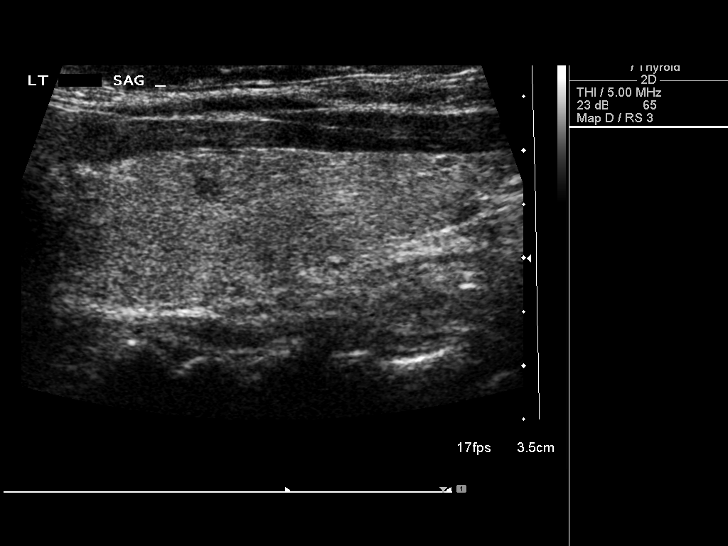
[im 34/34]
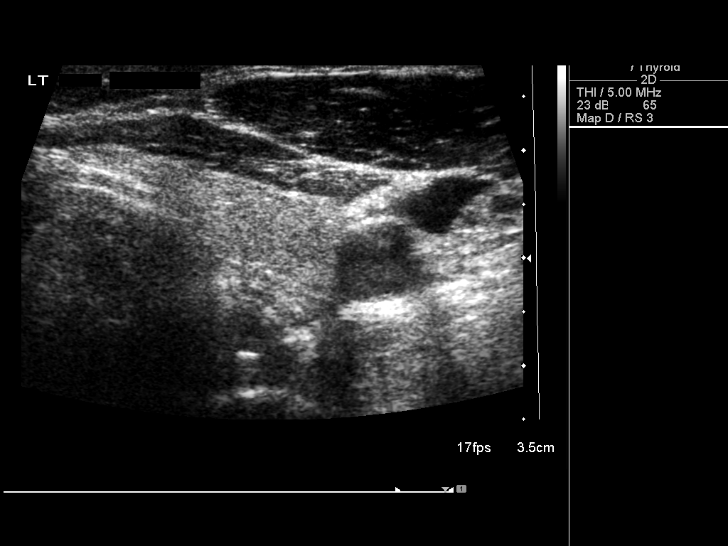

[14 of 25 positions shown; findings below may reference images not displayed]

FINDINGS: Right thyroid lobe:  Measures 4.7 x 1.1 x 2.0 cm and is homogeneous
in echotexture.
Left thyroid lobe:  Measures 4.7 x 1.2 x 1.7 cm and is homogeneous
in echotexture.
Isthmus:  Measures 3 mm.

Focal nodules:  A 3 mm hypoechoic nodule is seen in the mid pole of
the left thyroid.

Lymphadenopathy:  None visualized.
IMPRESSION: Normal thyroid.

## 2012-02-11 ENCOUNTER — Telehealth: Payer: Self-pay | Admitting: Internal Medicine

## 2012-02-11 NOTE — Telephone Encounter (Signed)
I called the Sallyanne Havers and gave her the date Xarelto was stopped, I faxed Dr Bruna Potter last note also.

## 2012-02-11 NOTE — Telephone Encounter (Signed)
New Problem:    Patient called in needing documentation on when Dr. Ladona Ridgel stopped the patient's [facility].  Spoke to [employer] in medical records and she claimed that there was no record of that.  Please call back.

## 2012-06-18 ENCOUNTER — Other Ambulatory Visit: Payer: Self-pay

## 2012-07-26 DIAGNOSIS — Z8249 Family history of ischemic heart disease and other diseases of the circulatory system: Secondary | ICD-10-CM | POA: Insufficient documentation

## 2012-07-27 DIAGNOSIS — E669 Obesity, unspecified: Secondary | ICD-10-CM | POA: Insufficient documentation

## 2012-10-26 ENCOUNTER — Other Ambulatory Visit: Payer: Self-pay | Admitting: Family Medicine

## 2012-10-26 DIAGNOSIS — Z1231 Encounter for screening mammogram for malignant neoplasm of breast: Secondary | ICD-10-CM

## 2012-12-08 ENCOUNTER — Ambulatory Visit
Admission: RE | Admit: 2012-12-08 | Discharge: 2012-12-08 | Disposition: A | Discharge status: Home / Self Care | Payer: BC Managed Care – PPO | Source: Ambulatory Visit | Attending: Family Medicine | Admitting: Family Medicine

## 2012-12-08 DIAGNOSIS — Z1231 Encounter for screening mammogram for malignant neoplasm of breast: Secondary | ICD-10-CM

## 2013-03-09 ENCOUNTER — Other Ambulatory Visit: Payer: Self-pay

## 2013-04-06 IMAGING — CR DG CHEST 1V PORT
1 series · 1 of 1 positions shown · non-contrast
Comparison: 03/27/2005

CLINICAL DATA: Irregular heart beat

CHEST - 1 VIEW

[view not recorded]
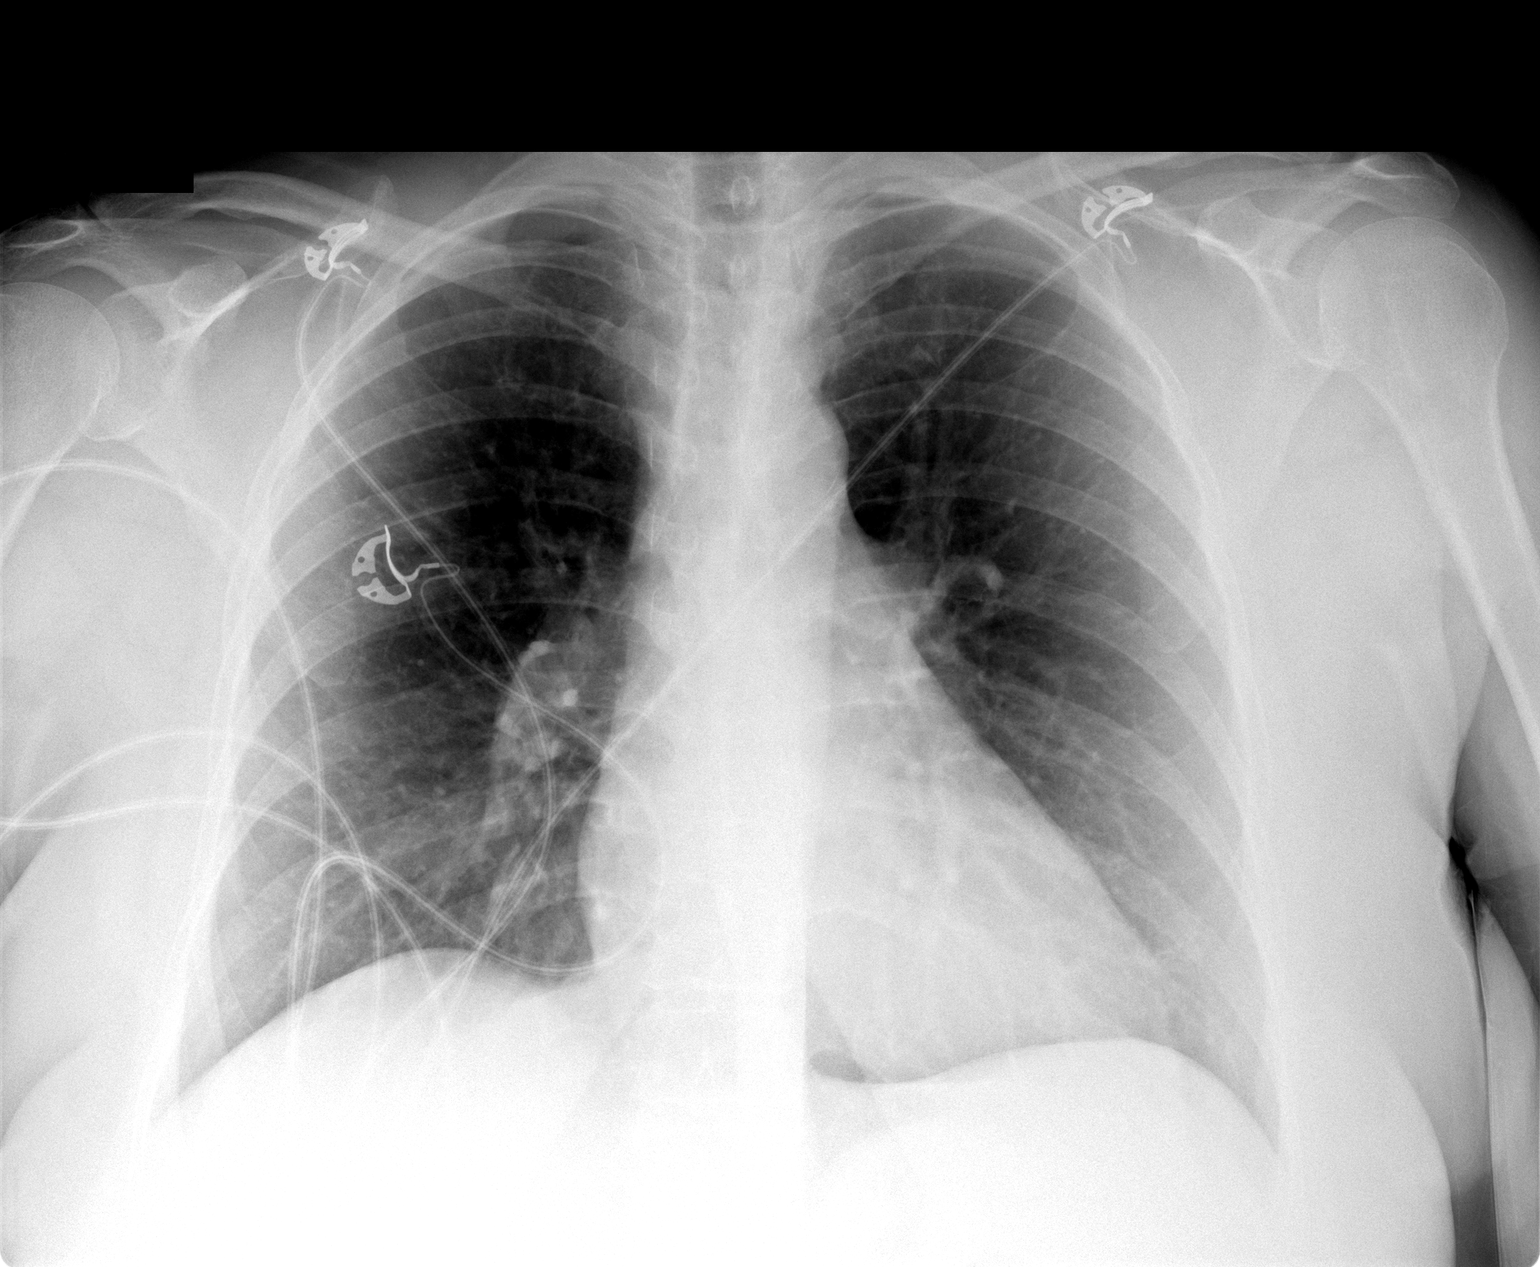

[1 of 1 positions shown; findings below may reference images not displayed]

FINDINGS: The heart size and mediastinal contours are within normal
limits.  Both lungs are clear.
IMPRESSION: No active disease.

## 2013-11-17 ENCOUNTER — Other Ambulatory Visit: Payer: Self-pay | Admitting: Family Medicine

## 2013-11-17 DIAGNOSIS — Z1231 Encounter for screening mammogram for malignant neoplasm of breast: Secondary | ICD-10-CM

## 2013-12-11 ENCOUNTER — Ambulatory Visit
Admission: RE | Admit: 2013-12-11 | Discharge: 2013-12-11 | Disposition: A | Discharge status: Home / Self Care | Payer: BC Managed Care – PPO | Source: Ambulatory Visit | Attending: Family Medicine | Admitting: Family Medicine

## 2013-12-11 DIAGNOSIS — Z1231 Encounter for screening mammogram for malignant neoplasm of breast: Secondary | ICD-10-CM

## 2014-04-12 ENCOUNTER — Encounter (HOSPITAL_COMMUNITY): Payer: Self-pay | Admitting: Internal Medicine

## 2015-07-24 ENCOUNTER — Other Ambulatory Visit: Payer: Self-pay

## 2015-07-24 DIAGNOSIS — Z1231 Encounter for screening mammogram for malignant neoplasm of breast: Secondary | ICD-10-CM

## 2015-07-26 DIAGNOSIS — E7801 Familial hypercholesterolemia: Secondary | ICD-10-CM | POA: Insufficient documentation

## 2015-08-05 ENCOUNTER — Ambulatory Visit: Payer: Self-pay

## 2015-12-28 ENCOUNTER — Other Ambulatory Visit: Payer: Self-pay | Admitting: Family Medicine

## 2015-12-28 DIAGNOSIS — Z1231 Encounter for screening mammogram for malignant neoplasm of breast: Secondary | ICD-10-CM

## 2016-01-10 ENCOUNTER — Ambulatory Visit
Admission: RE | Admit: 2016-01-10 | Discharge: 2016-01-10 | Disposition: A | Discharge status: Home / Self Care | Payer: BC Managed Care – PPO | Source: Ambulatory Visit | Attending: Family Medicine | Admitting: Family Medicine

## 2016-01-10 DIAGNOSIS — Z1231 Encounter for screening mammogram for malignant neoplasm of breast: Secondary | ICD-10-CM

## 2016-03-17 ENCOUNTER — Other Ambulatory Visit: Payer: Self-pay | Admitting: Orthopedic Surgery

## 2016-03-17 DIAGNOSIS — M67912 Unspecified disorder of synovium and tendon, left shoulder: Secondary | ICD-10-CM

## 2017-04-13 ENCOUNTER — Ambulatory Visit: Payer: BC Managed Care – PPO | Admitting: Family Medicine

## 2017-04-16 ENCOUNTER — Ambulatory Visit: Payer: BC Managed Care – PPO | Admitting: Family Medicine

## 2017-04-30 ENCOUNTER — Encounter: Payer: Self-pay | Admitting: Family Medicine

## 2017-04-30 ENCOUNTER — Ambulatory Visit: Payer: BC Managed Care – PPO | Admitting: Family Medicine

## 2017-04-30 VITALS — BP 118/80 | HR 77 | Temp 99.3°F | Ht 66.0 in | Wt 215.5 lb

## 2017-04-30 DIAGNOSIS — Z79899 Other long term (current) drug therapy: Secondary | ICD-10-CM

## 2017-04-30 DIAGNOSIS — E669 Obesity, unspecified: Secondary | ICD-10-CM

## 2017-04-30 DIAGNOSIS — Z1159 Encounter for screening for other viral diseases: Secondary | ICD-10-CM | POA: Diagnosis not present

## 2017-04-30 DIAGNOSIS — E7801 Familial hypercholesterolemia: Secondary | ICD-10-CM

## 2017-04-30 DIAGNOSIS — Z23 Encounter for immunization: Secondary | ICD-10-CM | POA: Diagnosis not present

## 2017-04-30 DIAGNOSIS — Z1231 Encounter for screening mammogram for malignant neoplasm of breast: Secondary | ICD-10-CM | POA: Diagnosis not present

## 2017-04-30 DIAGNOSIS — F339 Major depressive disorder, recurrent, unspecified: Secondary | ICD-10-CM

## 2017-04-30 DIAGNOSIS — Z1239 Encounter for other screening for malignant neoplasm of breast: Secondary | ICD-10-CM

## 2017-04-30 DIAGNOSIS — Z0000 Encounter for general adult medical examination without abnormal findings: Secondary | ICD-10-CM

## 2017-04-30 LAB — CBC WITH DIFFERENTIAL/PLATELET
Basophils Absolute: 0 10*3/uL (ref 0.0–0.1)
Basophils Relative: 0.1 % (ref 0.0–3.0)
EOS PCT: 3.3 % (ref 0.0–5.0)
Eosinophils Absolute: 0.2 10*3/uL (ref 0.0–0.7)
HCT: 43.1 % (ref 36.0–46.0)
Hemoglobin: 14.3 g/dL (ref 12.0–15.0)
LYMPHS ABS: 1.3 10*3/uL (ref 0.7–4.0)
Lymphocytes Relative: 17.1 % (ref 12.0–46.0)
MCHC: 33.2 g/dL (ref 30.0–36.0)
MCV: 93.5 fl (ref 78.0–100.0)
MONO ABS: 0.4 10*3/uL (ref 0.1–1.0)
Monocytes Relative: 5.8 % (ref 3.0–12.0)
NEUTROS ABS: 5.5 10*3/uL (ref 1.4–7.7)
NEUTROS PCT: 73.7 % (ref 43.0–77.0)
PLATELETS: 296 10*3/uL (ref 150.0–400.0)
RBC: 4.61 Mil/uL (ref 3.87–5.11)
RDW: 13.1 % (ref 11.5–15.5)
WBC: 7.4 10*3/uL (ref 4.0–10.5)

## 2017-04-30 LAB — COMPREHENSIVE METABOLIC PANEL
ALT: 17 U/L (ref 0–35)
AST: 13 U/L (ref 0–37)
Albumin: 4.6 g/dL (ref 3.5–5.2)
Alkaline Phosphatase: 60 U/L (ref 39–117)
BUN: 13 mg/dL (ref 6–23)
CHLORIDE: 104 meq/L (ref 96–112)
CO2: 31 meq/L (ref 19–32)
Calcium: 9.6 mg/dL (ref 8.4–10.5)
Creatinine, Ser: 0.82 mg/dL (ref 0.40–1.20)
GFR: 76.24 mL/min (ref >60.00)
GLUCOSE: 81 mg/dL (ref 70–99)
Potassium: 3.9 mEq/L (ref 3.5–5.1)
SODIUM: 141 meq/L (ref 135–145)
Total Bilirubin: 0.4 mg/dL (ref 0.2–1.2)
Total Protein: 6.8 g/dL (ref 6.0–8.3)

## 2017-04-30 LAB — LIPID PANEL
CHOL/HDL RATIO: 4
Cholesterol: 175 mg/dL (ref 0–200)
HDL: 45.6 mg/dL (ref >39.00)
NonHDL: 129.49
Triglycerides: 289 mg/dL — ABNORMAL HIGH (ref 0.0–149.0)
VLDL: 57.8 mg/dL — ABNORMAL HIGH (ref 0.0–40.0)

## 2017-04-30 LAB — LDL CHOLESTEROL, DIRECT: Direct LDL: 109 mg/dL

## 2017-04-30 LAB — TSH: TSH: 2.08 u[IU]/mL (ref 0.35–4.50)

## 2017-04-30 LAB — HEMOGLOBIN A1C: HEMOGLOBIN A1C: 5.3 % (ref 4.6–6.5)

## 2017-04-30 MED ORDER — ROSUVASTATIN CALCIUM 5 MG PO TABS: 5.0000 mg | ORAL_TABLET | Freq: Every day | ORAL | 3 refills | Status: DC

## 2017-04-30 MED ORDER — LIRAGLUTIDE -WEIGHT MANAGEMENT 18 MG/3ML ~~LOC~~ SOPN: PEN_INJECTOR | SUBCUTANEOUS | 3 refills | Status: DC

## 2017-04-30 NOTE — Patient Instructions (Signed)
It was so good seeing you again! Thank you for establishing with my new practice and allowing me to continue caring for you. It means a lot to me.   Please schedule a follow up appointment with me in 3 months to follow up on weight loss medication When you're feeling too down to do anything, try these 10 Little things!  Take a shower. Even if you plan to stay in all day long and not see a soul, take a shower. It takes the most effort to hop in to the shower but once you do, you'll feel immediate results. It will wake you up and you'll be feeling much fresher (and cleaner too).  Brush and floss your teeth. Give your teeth a good brushing with a floss finish. It's a small task but it feels so good and you can check 'taking care of your health' off the list of things to do.  Do something small on your list. Most of us have some small thing we would like to get done (load of laundry, sew a button, email a friend). Doing one of these things will make you feel like you've accomplished something.  Drink water. Drinking water is easy right? It's also really beneficial for your health so keep a glass beside you all day and take sips often. It gives you energy and prevents you from boredom eating.  Do some floor exercises. The last thing you want to do is exercise but it might be just the thing you need the most. Keep it simple and do exercises that involve sitting or laying on the floor. Even the smallest of exercises release chemicals in the brain that make you feel good. Yoga stretches or core exercises are going to make you feel good with minimal effort.  Make your bed. Making your bed takes a few minutes but it's productive and you'll feel relieved when it's done. An unmade bed is a huge visual reminder that you're having an unproductive day. Do it and consider it your housework for the day.  Put on some nice clothes. Take the sweatpants off even if you don't plan to go anywhere. Put on clothes  that make you feel good. Take a look in the mirror so your brain recognizes the sweatpants have been replaced with clothes that make you look great. It's an instant confidence booster.  Wash the dishes. A pile of dirty dishes in the sink is a reflection of your mood. It's possible that if you wash up the dishes, your mood will follow suit. It's worth a try.  Cook a real meal. If you have the luxury to have a "do nothing" day, you have time to make a real meal for yourself. Make a meal that you love to eat. The process is good to get you out of the funk and the food will ensure you have more energy for tomorrow.  Write out your thoughts by hand. When you hand write, you stimulate your brain to focus on the moment that you're in so make yourself comfortable and write whatever comes into your mind. Put those thoughts out on paper so they stop spinning around in your head. Those thoughts might be the very thing holding you down.    Please do these things to maintain good health!   Exercise at least 30-45 minutes a day,  4-5 days a week.   Eat a low-fat diet with lots of fruits and vegetables, up to 7-9 servings per day.  Drink  plenty of water daily. Try to drink 8 8oz glasses per day.  Seatbelts can save your life. Always wear your seatbelt.  Place Smoke Detectors on every level of your home and check batteries every year.  Schedule an appointment with an eye doctor for an eye exam every 1-2 years  Safe sex - use condoms to protect yourself from STDs if you could be exposed to these types of infections. Use birth control if you do not want to become pregnant and are sexually active.  Avoid heavy alcohol use. If you drink, keep it to less than 2 drinks/day and not every day.  Health Care Power of Attorney.  Choose someone you trust that could speak for you if you became unable to speak for yourself.  Depression is common in our stressful world.If you're feeling down or losing interest in  things you normally enjoy, please come in for a visit.  If anyone is threatening or hurting you, please get help. Physical or Emotional Violence is never OK.

## 2017-04-30 NOTE — Addendum Note (Signed)
Addended by: Jimmye NormanPHANOS,  J on: 04/30/2017 03:07 PM   Modules accepted: Orders

## 2017-04-30 NOTE — Progress Notes (Signed)
Subjective  Chief Complaint  Patient presents with  . Establish Care    [name]  . Annual Exam    Not fasting    HPI: [name] [name] is a 56 y.o. female who presents to [facility] today for a Female Wellness Visit. She also has the concerns regarding chronic problem f/u. These will be addressed in addition to the Health Maintenance Visit.   Wellness Visit: annual visit with health maintenance review and exam without Pap   She is a former [facility] patient and is here to reestablish care with me today.   Due for cpe. Pap is up to date. Due for mammogram. Due for flu shot Lifestyle: Body mass index is 34.78 kg/m. Wt Readings from Last 3 Encounters:  04/30/17 215 lb 8 oz (97.8 kg)  02/01/12 220 lb 12.8 oz (100.2 kg)  12/15/11 210 lb (95.3 kg)   Diet: general Exercise: infrequently, bicycling  Chronic disease management visit and/or acute problem visit:  Depression - Has struggled over the last year due to recurrent moderate to severe depression. Seeing psych who now has her off effexor and on rexulti, lithium and lamictal. Has had major depression since age 56; not suicidal. She has f/u in 2-3 weeks. Currently unemployed as well. Supportive wife at home.   Hyperlipidemia - improved / controlled on crestor and zetia. No AEs. Good compliance. No cp.  Obesity - approved again now for saxenda. Has done OK on the medication, but due to antipsychotics, hasn't been able to lose much (but hasn't gained either). Would like to restart. Exercise is limited due to depressive sxs - low mood, low motivation. Hypokinetic.  Patient Active Problem List   Diagnosis Date Noted  . 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 Obesity (BMI 30-39.9) 07/27/2012    Priority: High  . Family history of premature CAD 07/26/2012    Priority: High  . Chronic recurrent major depressive disorder (HCC) 09/09/2011    Priority: High  . Retinal  detachment 12/31/2011    Priority: Medium  . History of atrial flutter 09/29/2011    Priority: Medium  . AR (allergic rhinitis) 08/01/2010    Priority: Low   Health Maintenance  Topic Date Due  . Hepatitis C Screening  03-10-1961  . HIV Screening  10/30/1974  . INFLUENZA VACCINE  12/02/2016  . MAMMOGRAM  01/09/2017  . PAP SMEAR  07/26/2018  . COLONOSCOPY  03/22/2022  . TETANUS/TDAP  08/06/2023   Immunization History  Administered Date(s) Administered  . Tdap 10/07/2010, 08/05/2013   We updated and reviewed the patient's past history in detail and it is documented below. Allergies: Patient is allergic to wellbutrin [bupropion]; aripiprazole; and statins. Past Medical History Patient  has a past medical history of Atrial flutter with rapid ventricular response (HCC) (09/08/2011), Detached retina, High cholesterol, and Hyperlipemia (09/08/2011). Past Surgical History Patient  has a past surgical history that includes Appendectomy; Tonsillectomy; Tubal ligation; TEE without cardioversion (09/09/2011); TEE without cardioversion (12/04/2011); Cardioversion (12/04/2011); and Atrial flutter ablation (N/A, 12/15/2011). Family History: Patient family history includes Coronary artery disease in her brother and father; Hypertension in her mother; Stroke in her mother. Social History:  Patient  reports that she quit smoking about 16 years ago. she has never used smokeless tobacco. She reports that she drinks about 1.2 oz of alcohol per week. She reports that she does not use drugs.  Review of Systems: Constitutional: negative for fever or malaise Ophthalmic: negative for  photophobia, double vision or loss of vision Cardiovascular: negative for chest pain, dyspnea on exertion, or new LE swelling Respiratory: negative for SOB or persistent cough Gastrointestinal: negative for abdominal pain, change in bowel habits or melena Genitourinary: negative for dysuria or gross hematuria, no abnormal uterine  bleeding or disharge Musculoskeletal: negative for new gait disturbance or muscular weakness Integumentary: negative for new or persistent rashes, no breast lumps Neurological: negative for TIA or stroke symptoms Psychiatric: negative for SI or delusions Allergic/Immunologic: negative for hives  Patient Care Team    Relationship Specialty Notifications Start End  Willow Ora, MD PCP - General Family Medicine  04/30/17   Dorise Bullion, FNP, Psych  Objective  Vitals: BP 118/80 (BP Location: Left Arm, Patient Position: Sitting, Cuff Size: Large)   Pulse 77   Temp 99.3 F (37.4 C) (Oral)   Ht 5\' 6"  (1.676 m)   Wt 215 lb 8 oz (97.8 kg)   SpO2 95%   BMI 34.78 kg/m  General:  Well developed, well nourished, no acute distress  Psych:  Alert and orientedx3, flat mood and affect, hypokinetic HEENT:  Normocephalic, atraumatic, non-icteric sclera, PERRL, oropharynx is clear without mass or exudate, supple neck without adenopathy, mass or thyromegaly Cardiovascular:  Normal S1, S2, RRR without gallop, rub or murmur, nondisplaced PMI Respiratory:  Good breath sounds bilaterally, CTAB with normal respiratory effort Gastrointestinal: normal bowel sounds, soft, non-tender, no noted masses. No HSM MSK: no deformities, contusions. Joints are without erythema or swelling. Spine and CVA region are nontender Skin:  Warm, no rashes or suspicious lesions noted Neurologic:    Mental status is normal. CN 2-11 are normal. Gross motor and sensory exams are normal. Normal gait. No tremor Breast Exam: No mass, skin retraction or nipple discharge is appreciated in either breast. No axillary adenopathy. Fibrocystic changes are not noted  Assessment  1. Annual physical exam   2. Need for hepatitis C screening test   3. Familial hypercholesteremia   4. Obesity (BMI 30-39.9)   5. Chronic recurrent major depressive disorder (HCC)   6. High risk medication use   7. Breast cancer screening      Plan    Female Wellness Visit:  Age appropriate Health Maintenance and Prevention measures were discussed with patient. Included topics are cancer screening recommendations, ways to keep healthy (see AVS) including dietary and exercise recommendations, regular eye and dental care, use of seat belts, and avoidance of moderate alcohol use and tobacco use. Ordered mammogram. Discussed diet and exercise   BMI: discussed patient's BMI and encouraged positive lifestyle modifications to help get to or maintain a target BMI.  HM needs and immunizations were addressed and ordered. See below for orders. See HM and immunization section for updates. Influenza given today  Routine labs and screening tests ordered including cmp, cbc and lipids where appropriate.  Discussed recommendations regarding Vit D and calcium supplementation (see AVS)  Chronic disease f/u and/or acute problem visit: (deemed necessary to be done in addition to the wellness visit):  Chronic recurrent depression - on multiple medications. To discuss with psych the dx: ? Bipolar depression or unipolar. ? Trintellix. Would consider Transcutaneous magnetic stimulation therapy if can't get improved. Check a1c and tsh due to meds.   Hyperlipidemia fu: This medical condition is well controlled. There are no signs of complications, medication side effects, or red flags. Patient is instructed to continue the current treatment plan without change in therapies or medications.   Weight loss fu: to restart  and titrate up saxenda for now. Avoid appetite suppressant due to h/o a-flutter.     Follow up: Return in about 3 months (around 07/29/2017) for follow up on saxenda. or as needed for any worsening symptoms or changes in condition.   Commons side effects, risks, benefits, and alternatives for medications and treatment plan prescribed today were discussed, and the patient expressed understanding of the given instructions. Patient is instructed to call  or message via MyChart if he/she has any questions or concerns regarding our treatment plan. No barriers to understanding were identified. We discussed Red Flag symptoms and signs in detail. Patient expressed understanding regarding what to do in case of urgent or emergency type symptoms.   Medication list was reconciled, printed and provided to the patient in AVS. Patient instructions and summary information was reviewed with the patient as documented in the AVS. This note was prepared with assistance of Dragon voice recognition software. Occasional wrong-word or sound-a-like substitutions may have occurred due to the inherent limitations of voice recognition software  Orders Placed This Encounter  Procedures  . MM DIGITAL SCREENING BILATERAL  . CBC with Differential/Platelet  . Comprehensive metabolic panel  . Lipid panel  . HIV antibody  . Hepatitis C antibody  . TSH  . Hemoglobin A1c   Meds ordered this encounter  Medications  . rosuvastatin (CRESTOR) 5 MG tablet    Sig: Take 1 tablet (5 mg total) by mouth at bedtime.    Dispense:  90 tablet    Refill:  3  . Liraglutide -Weight Management (SAXENDA) 18 MG/3ML SOPN    Sig: Inject 0.6 mg into the skin daily for 7 days, THEN 1.2 mg daily for 7 days, THEN 1.8 mg daily for 7 days, THEN 2.4 mg daily.    Dispense:  9 mL    Refill:  3

## 2017-05-01 LAB — HEPATITIS C ANTIBODY
HEP C AB: NONREACTIVE
SIGNAL TO CUT-OFF: 0.01 (ref <1.00)

## 2017-05-01 LAB — HIV ANTIBODY (ROUTINE TESTING W REFLEX): HIV 1&2 Ab, 4th Generation: NONREACTIVE

## 2017-06-22 ENCOUNTER — Encounter: Payer: Self-pay | Admitting: Family Medicine

## 2017-06-24 MED ORDER — LIRAGLUTIDE -WEIGHT MANAGEMENT 18 MG/3ML ~~LOC~~ SOPN: 3.0000 mg | PEN_INJECTOR | Freq: Every day | SUBCUTANEOUS | 3 refills | Status: DC

## 2017-06-24 MED ORDER — EZETIMIBE 10 MG PO TABS: ORAL_TABLET | ORAL | 3 refills | Status: DC

## 2017-08-24 ENCOUNTER — Encounter: Payer: Self-pay | Admitting: Family Medicine

## 2017-09-10 ENCOUNTER — Telehealth: Payer: Self-pay | Admitting: Family Medicine

## 2017-09-10 NOTE — Telephone Encounter (Signed)
Pt dropped off forms to be completed and asked that they are mailed to address on forms, place in bin upfront with charge sheet.

## 2017-09-13 NOTE — Telephone Encounter (Signed)
Forms completed. Please copy and mail.

## 2017-09-13 NOTE — Telephone Encounter (Signed)
Forms mailed to address specified

## 2017-12-21 ENCOUNTER — Encounter: Payer: Self-pay | Admitting: Family Medicine

## 2018-05-02 ENCOUNTER — Telehealth: Payer: Self-pay

## 2018-05-02 NOTE — Telephone Encounter (Signed)
Please advise if referral to [facility] can be made for movement disorder.

## 2018-05-02 NOTE — Telephone Encounter (Signed)
Yes, please send referral

## 2018-05-02 NOTE — Telephone Encounter (Signed)
Copied from CRM [id number]. Topic: Referral - Request for Referral >> May 02, 2018 11:10 AM Lynne LoganHudson, Caryn D wrote: Has patient seen PCP for this complaint? Yes *If NO, is insurance requiring patient see PCP for this issue before PCP can refer them? Referral for which specialty: Neurologist Preferred provider/office: [facility] Reason for referral: Movement Disorder / Tardive Dyskinesia

## 2018-05-03 ENCOUNTER — Encounter: Payer: Self-pay | Admitting: *Deleted

## 2018-05-03 ENCOUNTER — Other Ambulatory Visit: Payer: Self-pay | Admitting: *Deleted

## 2018-05-03 DIAGNOSIS — G2401 Drug induced subacute dyskinesia: Secondary | ICD-10-CM

## 2018-05-03 NOTE — Telephone Encounter (Signed)
A referral has been made

## 2018-05-13 ENCOUNTER — Other Ambulatory Visit: Payer: Self-pay | Admitting: Family Medicine

## 2018-05-17 NOTE — Telephone Encounter (Signed)
[name] [name] with [facility] requesting office notes regarding referral reason to be faxed to [phone]. Does not need ATTN to anyone in particular.

## 2018-05-18 NOTE — Telephone Encounter (Signed)
Per [facility]Patient has not seen PCP since 04-2017 and there is no office notes about DX. We will need office notes about DX before we could sch the appt thank you for the referral.  Dr. Mardelle Matte,  Does pt need to come in for office visit/cpe to see you?  Thanks, Marylene Land

## 2018-05-18 NOTE — Telephone Encounter (Signed)
Just spoke to pt, she states that [facility] called her to request office notes and pt was able to get psych to send them. [facility] should be calling pt back to schedule an appt. Also pt has been schedule with you for cpe on 1/31.

## 2018-05-18 NOTE — Telephone Encounter (Signed)
Pt is treated by psych: please notify patient: does psych have notes about her tardive dyskinesia that could be used.  As well, would [facility] be the same? And please let her know she is overdue for her annual exam. Thanks.  If all that doesn't work, then she needs to come in first. Thanks

## 2018-05-19 LAB — HM MAMMOGRAPHY

## 2018-05-19 NOTE — Telephone Encounter (Signed)
Please call patient. See note from Dr. Arbutus Leas. Notify patient.

## 2018-05-19 NOTE — Telephone Encounter (Signed)
-----   Message from Octaviano Batty Tat, DO sent at 05/19/2018 12:20 PM EST ----- Dr. Mardelle Matte,  I received a referral on this patient.  I just got her psychiatry records and they are treating her appropriately for TD.  I really don't have anything further to add to her case, unfortunately.  We do appreciate your referrals and thank you, but will have to decline this one as I don't think that I can help her further.  Thank you,  Lurena Joiner Tat

## 2018-05-20 ENCOUNTER — Encounter: Payer: Self-pay | Admitting: *Deleted

## 2018-05-27 ENCOUNTER — Encounter: Payer: Self-pay | Admitting: *Deleted

## 2018-05-27 LAB — HM MAMMOGRAPHY

## 2018-06-03 ENCOUNTER — Other Ambulatory Visit: Payer: Self-pay

## 2018-06-03 ENCOUNTER — Ambulatory Visit (INDEPENDENT_AMBULATORY_CARE_PROVIDER_SITE_OTHER): Payer: BC Managed Care – PPO | Admitting: Family Medicine

## 2018-06-03 ENCOUNTER — Encounter: Payer: Self-pay | Admitting: Family Medicine

## 2018-06-03 ENCOUNTER — Other Ambulatory Visit (HOSPITAL_COMMUNITY)
Admission: RE | Admit: 2018-06-03 | Discharge: 2018-06-03 | Disposition: A | Discharge status: Home / Self Care | Payer: BC Managed Care – PPO | Source: Ambulatory Visit | Attending: Family Medicine | Admitting: Family Medicine

## 2018-06-03 VITALS — BP 122/76 | HR 74 | Temp 98.4°F | Resp 16 | Ht 66.0 in | Wt 194.4 lb

## 2018-06-03 DIAGNOSIS — F339 Major depressive disorder, recurrent, unspecified: Secondary | ICD-10-CM

## 2018-06-03 DIAGNOSIS — E7801 Familial hypercholesterolemia: Secondary | ICD-10-CM

## 2018-06-03 DIAGNOSIS — G2401 Drug induced subacute dyskinesia: Secondary | ICD-10-CM | POA: Insufficient documentation

## 2018-06-03 DIAGNOSIS — Z124 Encounter for screening for malignant neoplasm of cervix: Secondary | ICD-10-CM | POA: Insufficient documentation

## 2018-06-03 DIAGNOSIS — Z8249 Family history of ischemic heart disease and other diseases of the circulatory system: Secondary | ICD-10-CM | POA: Diagnosis not present

## 2018-06-03 DIAGNOSIS — N952 Postmenopausal atrophic vaginitis: Secondary | ICD-10-CM | POA: Insufficient documentation

## 2018-06-03 DIAGNOSIS — Z0000 Encounter for general adult medical examination without abnormal findings: Secondary | ICD-10-CM | POA: Diagnosis not present

## 2018-06-03 LAB — COMPREHENSIVE METABOLIC PANEL
ALT: 16 U/L (ref 0–35)
AST: 14 U/L (ref 0–37)
Albumin: 4.4 g/dL (ref 3.5–5.2)
Alkaline Phosphatase: 64 U/L (ref 39–117)
BILIRUBIN TOTAL: 0.5 mg/dL (ref 0.2–1.2)
BUN: 13 mg/dL (ref 6–23)
CALCIUM: 9.9 mg/dL (ref 8.4–10.5)
CO2: 30 meq/L (ref 19–32)
CREATININE: 0.79 mg/dL (ref 0.40–1.20)
Chloride: 102 mEq/L (ref 96–112)
GFR: 74.59 mL/min (ref >60.00)
GLUCOSE: 89 mg/dL (ref 70–99)
Potassium: 4 mEq/L (ref 3.5–5.1)
Sodium: 140 mEq/L (ref 135–145)
Total Protein: 6.2 g/dL (ref 6.0–8.3)

## 2018-06-03 LAB — CBC WITH DIFFERENTIAL/PLATELET
BASOS ABS: 0 10*3/uL (ref 0.0–0.1)
Basophils Relative: 0.3 % (ref 0.0–3.0)
EOS ABS: 0.2 10*3/uL (ref 0.0–0.7)
Eosinophils Relative: 3.4 % (ref 0.0–5.0)
HCT: 42.2 % (ref 36.0–46.0)
Hemoglobin: 14.3 g/dL (ref 12.0–15.0)
LYMPHS ABS: 1.1 10*3/uL (ref 0.7–4.0)
Lymphocytes Relative: 18.9 % (ref 12.0–46.0)
MCHC: 34 g/dL (ref 30.0–36.0)
MCV: 93.7 fl (ref 78.0–100.0)
MONO ABS: 0.5 10*3/uL (ref 0.1–1.0)
Monocytes Relative: 8.2 % (ref 3.0–12.0)
NEUTROS ABS: 4 10*3/uL (ref 1.4–7.7)
NEUTROS PCT: 69.2 % (ref 43.0–77.0)
PLATELETS: 269 10*3/uL (ref 150.0–400.0)
RBC: 4.5 Mil/uL (ref 3.87–5.11)
RDW: 12.7 % (ref 11.5–15.5)
WBC: 5.8 10*3/uL (ref 4.0–10.5)

## 2018-06-03 LAB — LIPID PANEL
Cholesterol: 197 mg/dL (ref 0–200)
HDL: 47.2 mg/dL (ref >39.00)
LDL Cholesterol: 126 mg/dL — ABNORMAL HIGH (ref 0–99)
NonHDL: 149.63
TRIGLYCERIDES: 116 mg/dL (ref 0.0–149.0)
Total CHOL/HDL Ratio: 4
VLDL: 23.2 mg/dL (ref 0.0–40.0)

## 2018-06-03 LAB — TSH: TSH: 1.59 u[IU]/mL (ref 0.35–4.50)

## 2018-06-03 NOTE — Progress Notes (Signed)
Subjective  Chief Complaint  Patient presents with  . Annual Exam    Fasting    HPI: [name] [name] is a 58 y.o. female who presents to [facility] today for a Female Wellness Visit. She also has the concerns and/or needs as listed above in the chief complaint. These will be addressed in addition to the Health Maintenance Visit.   Wellness Visit: annual visit with health maintenance review and exam with Pap   HM: due for pap. No vaginal bleeding. imms up to date. mammo with recall ok.  Obesity: has lost about 20 pounds! Eating better.   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  Chronic disease f/u and/or acute problem visit: (deemed necessary to be done in addition to the wellness visit):  Depression: mildly active due to Tardive dyskinesia. Had been on meds but stopped due to side effects (headache). Mouth movements are constant and tiresome. Inside of mucosa is sore. [facility] neuro declined referral. On effexor per psychiatry.   HLD: on zetia and statin. Tolerating ok. Fasting for recheck. No cp.   ROS: negative for vaginal dryness or pain  Assessment  1. Annual physical exam   2. Chronic recurrent major depressive disorder (HCC)   3. Family history of premature CAD   4. Familial hypercholesteremia   5. Cervical cancer screening   6. Atrophic vaginitis   7. Tardive dyskinesia      Plan  Female Wellness Visit:  Age appropriate Health Maintenance and Prevention measures were discussed with patient. Included topics are cancer screening recommendations, ways to keep healthy (see AVS) including dietary and exercise recommendations, regular eye and dental care, use of seat belts, and avoidance of moderate alcohol use and tobacco use.   BMI: discussed patient's BMI and encouraged positive lifestyle modifications to help get to or maintain a target BMI.  HM needs and immunizations were addressed and ordered. See below for  orders. See HM and immunization section for updates.  Routine labs and screening tests ordered including cmp, cbc and lipids where appropriate.  Discussed recommendations regarding Vit D and calcium supplementation (see AVS)  Chronic disease management visit and/or acute problem visit:  Depression and TD per psych. Offered neuro referral to [facility] or tertiary care if worsens.   HLD: recheck labs and refill meds if at goal. Has been well controlled.   Obesity: praised for weight loss and dietary changes.  Atrophic vaginitis: on exam. Denies clinical sxs  Follow up: No follow-ups on file.  Orders Placed This Encounter  Procedures  . Comprehensive metabolic panel  . CBC with Differential/Platelet  . Lipid panel  . TSH   No orders of the defined types were placed in this encounter.     Lifestyle: Body mass index is 31.38 kg/m. Wt Readings from Last 3 Encounters:  06/03/18 194 lb 6.4 oz (88.2 kg)  04/30/17 215 lb 8 oz (97.8 kg)  02/01/12 220 lb 12.8 oz (100.2 kg)   Diet: low fat Exercise: intermittently,   Patient Active Problem List   Diagnosis Date Noted  . 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 Obesity (BMI 30-39.9) 07/27/2012    Priority: High  . Family history of premature CAD 07/26/2012    Priority: High    Overview:  Brother and father   . Chronic recurrent major depressive disorder (HCC) 09/09/2011    Priority: High    Venia MinksLeslie Brewington, FNP psych; failed long term effexor, abilify and seroquel   .  Retinal detachment 12/31/2011    Priority: Medium    Overview:  Dr Stephannie LiJason Sanders, retinal specialist   . History of atrial flutter 09/29/2011    Priority: Medium    Overview:  Atrial flutter - s/p cardioversion x 2, s/p ablation 12/2011 [facility], Dr. Allyson SabalBerry   . Atrophic vaginitis 06/03/2018    Priority: Low  . AR (allergic rhinitis) 08/01/2010    Priority: Low  . Tardive dyskinesia 06/03/2018   Health Maintenance    Topic Date Due  . MAMMOGRAM  05/28/2019  . PAP SMEAR-Modifier  07/25/2020  . COLONOSCOPY  03/22/2022  . TETANUS/TDAP  08/06/2023  . INFLUENZA VACCINE  Completed  . Hepatitis C Screening  Completed  . HIV Screening  Completed   Immunization History  Administered Date(s) Administered  . Influenza,inj,Quad PF,6+ Mos 04/30/2017, 03/04/2018  . Tdap 10/07/2010, 08/05/2013   We updated and reviewed the patient's past history in detail and it is documented below. Allergies: Patient is allergic to wellbutrin [bupropion]; aripiprazole; and statins. Past Medical History Patient  has a past medical history of Atrial flutter with rapid ventricular response (HCC) (09/08/2011), Detached retina, High cholesterol, and Hyperlipemia (09/08/2011). Past Surgical History Patient  has a past surgical history that includes Appendectomy; Tonsillectomy; Tubal ligation; TEE without cardioversion (09/09/2011); TEE without cardioversion (12/04/2011); Cardioversion (12/04/2011); and Atrial flutter ablation (N/A, 12/15/2011). Family History: Patient family history includes Coronary artery disease in her brother and father; Hypertension in her mother; Stroke in her mother. Social History:  Patient  reports that she quit smoking about 17 years ago. She has never used smokeless tobacco. She reports current alcohol use of about 2.0 standard drinks of alcohol per week. She reports that she does not use drugs.  Review of Systems: Constitutional: negative for fever or malaise Ophthalmic: negative for photophobia, double vision or loss of vision Cardiovascular: negative for chest pain, dyspnea on exertion, or new LE swelling Respiratory: negative for SOB or persistent cough Gastrointestinal: negative for abdominal pain, change in bowel habits or melena Genitourinary: negative for dysuria or gross hematuria, no abnormal uterine bleeding or disharge Musculoskeletal: negative for new gait disturbance or muscular  weakness Integumentary: negative for new or persistent rashes, no breast lumps Neurological: negative for TIA or stroke symptoms Psychiatric: negative for SI or delusions Allergic/Immunologic: negative for hives  Patient Care Team    Relationship Specialty Notifications Start End  Willow OraAndy, Camille L, MD PCP - General Family Medicine  04/30/17     Objective  Vitals: BP 122/76   Pulse 74   Temp 98.4 F (36.9 C) (Oral)   Resp 16   Ht 5\' 6"  (1.676 m)   Wt 194 lb 6.4 oz (88.2 kg)   SpO2 96%   BMI 31.38 kg/m  General:  Well developed, well nourished, no acute distress , + facial tics present Psych:  Alert and orientedx3,normal mood and affect HEENT:  Normocephalic, atraumatic, non-icteric sclera, PERRL, oropharynx is clear without mass or exudate, supple neck without adenopathy, mass or thyromegaly Cardiovascular:  Normal S1, S2, RRR without gallop, rub or murmur, nondisplaced PMI Respiratory:  Good breath sounds bilaterally, CTAB with normal respiratory effort Gastrointestinal: normal bowel sounds, soft, non-tender, no noted masses. No HSM MSK: no deformities, contusions. Joints are without erythema or swelling. Spine and CVA region are nontender Skin:  Warm, no rashes or suspicious lesions noted Neurologic:    Mental status is normal. CN 2-11 are normal. Gross motor and sensory exams are normal. Normal gait. No tremor Breast Exam: No  mass, skin retraction or nipple discharge is appreciated in either breast. No axillary adenopathy. Fibrocystic changes are not noted Pelvic Exam: Normal external genitalia, no vulvar or vaginal lesions present. Atrophic epithelium,  Clear cervix w/o CMT, Bimanual exam reveals a nontender fundus w/o masses, nl size. No adnexal masses present. No inguinal adenopathy. A PAP smear was performed.    Commons side effects, risks, benefits, and alternatives for medications and treatment plan prescribed today were discussed, and the patient expressed understanding of  the given instructions. Patient is instructed to call or message via MyChart if he/she has any questions or concerns regarding our treatment plan. No barriers to understanding were identified. We discussed Red Flag symptoms and signs in detail. Patient expressed understanding regarding what to do in case of urgent or emergency type symptoms.   Medication list was reconciled, printed and provided to the patient in AVS. Patient instructions and summary information was reviewed with the patient as documented in the AVS. This note was prepared with assistance of Dragon voice recognition software. Occasional wrong-word or sound-a-like substitutions may have occurred due to the inherent limitations of voice recognition software

## 2018-06-03 NOTE — Patient Instructions (Addendum)
Please return in 12 months for your annual complete physical; please come fasting.  I will release your lab results to you on your MyChart account with further instructions. Please reply with any questions.    If you have any questions or concerns, please don't hesitate to send me a message via MyChart or call the office at [phone]. Thank you for visiting with us today! It's our pleasure caring for you.  Please do these things to maintain good health!   Exercise at least 30-45 minutes a day,  4-5 days a week.   Eat a low-fat diet with lots of fruits and vegetables, up to 7-9 servings per day.  Drink plenty of water daily. Try to drink 8 8oz glasses per day.  Seatbelts can save your life. Always wear your seatbelt.  Place Smoke Detectors on every level of your home and check batteries every year.  Schedule an appointment with an eye doctor for an eye exam every 1-2 years  Safe sex - use condoms to protect yourself from STDs if you could be exposed to these types of infections. Use birth control if you do not want to become pregnant and are sexually active.  Avoid heavy alcohol use. If you drink, keep it to less than 2 drinks/day and not every day.  Health Care Power of Attorney.  Choose someone you trust that could speak for you if you became unable to speak for yourself.  Depression is common in our stressful world.If you're feeling down or losing interest in things you normally enjoy, please come in for a visit.  If anyone is threatening or hurting you, please get help. Physical or Emotional Violence is never OK.   

## 2018-06-07 LAB — CYTOLOGY - PAP
DIAGNOSIS: NEGATIVE
HPV: NOT DETECTED

## 2018-08-01 ENCOUNTER — Other Ambulatory Visit: Payer: Self-pay | Admitting: Family Medicine

## 2018-09-21 ENCOUNTER — Ambulatory Visit (INDEPENDENT_AMBULATORY_CARE_PROVIDER_SITE_OTHER): Payer: BC Managed Care – PPO | Admitting: Family Medicine

## 2018-09-21 ENCOUNTER — Encounter: Payer: Self-pay | Admitting: Family Medicine

## 2018-09-21 ENCOUNTER — Other Ambulatory Visit: Payer: Self-pay

## 2018-09-21 VITALS — BP 143/87 | HR 85 | Wt 195.0 lb

## 2018-09-21 DIAGNOSIS — R0789 Other chest pain: Secondary | ICD-10-CM | POA: Diagnosis not present

## 2018-09-21 DIAGNOSIS — R030 Elevated blood-pressure reading, without diagnosis of hypertension: Secondary | ICD-10-CM

## 2018-09-21 DIAGNOSIS — F430 Acute stress reaction: Secondary | ICD-10-CM

## 2018-09-21 NOTE — Progress Notes (Signed)
Virtual Visit via Video Note  Subjective  CC:  Chief Complaint  Patient presents with   Hypertension    Has had some higher than normal readings thinks it is related to some increased anxiety. Yesterday reading was 145/116 today 143 87   Depression    Feeling more anxirty than depression talked to her psychologist this morning     I connected with [name] [name] on 09/21/18 at  3:40 PM EDT by a video enabled telemedicine application and verified that I am speaking with the correct person using two identifiers. Location patient: Home Location provider: [facility] [place] [address] Persons participating in the virtual visit: [name] [name], Willow Oraamille L Claris Guymon, MD Rita Oharaiara Simmons, CMA  I discussed the limitations of evaluation and management by telemedicine and the availability of in person appointments. The patient expressed understanding and agreed to proceed. HPI: [name] [name] is a 58 y.o. female who was contacted today to address the problems listed above in the chief complaint.  [name] [name] reports atypical heaviness in her chest that has been ongoing for the last several days.  She was feeling fine until Friday when she was cleaning her garage in the heat.  She became overheated.  Felt lightheaded and sweaty.  She had to stop, drink water and rest.  However the rest of the weekend she felt fatigued.  She reports a heaviness in her chest.  She reports feeling anxious.  She admits that she is feeling more anxious due to the COVID pandemic.  She is mainly staying home venturing out to 3 times per week for groceries etc.  She denies a panic attack.  She denies shortness of breath, dyspnea on exertion, exertional symptoms.  She reports she has not been sleeping well either.  She did talk to her psychologist today.  However they both doubt that this is related to anxiety.  She did check her blood pressure on a few occasions when she felt the chest pain and it was elevated.  She  denies palpitations.  She denies lower extremity edema.  She did take Valium which disorder made her feel tired, her blood pressure normalized on the Valium.  Her depression is otherwise been stable.  She has no history of panic or anxiety.  She is concerned this could be related to her heart.  She has hyperlipidemia but no history of hypertension or heart disease.  Assessment  1. Elevated blood pressure reading without diagnosis of hypertension   2. Stress reaction   3. Atypical chest pain      Plan   Atypical chest pain with elevated blood pressure, along with stress: Her to sort out via video visit.  We will see patient in the office tomorrow morning for further assessment, physical exam, and EKG.  Then can address whether stress test is needed or not.  We will also recheck blood pressure.  Reassured.  Continue current medications.  She will go to the emergency room if she gets chest pressure or pain before her visit. I discussed the assessment and treatment plan with the patient. The patient was provided an opportunity to ask questions and all were answered. The patient agreed with the plan and demonstrated an understanding of the instructions.   The patient was advised to call back or seek an in-person evaluation if the symptoms worsen or if the condition fails to improve as anticipated. Follow up: Return in about 1 day (around 09/22/2018) for recheck.  Visit date not found  No orders of the defined types were placed in this encounter.     I reviewed the patients updated PMH, FH, and SocHx.    Patient Active Problem List   Diagnosis Date Noted   Tardive dyskinesia 06/03/2018    Priority: High   Familial hypercholesteremia 07/26/2015    Priority: High   Obesity (BMI 30-39.9) 07/27/2012    Priority: High   Family history of premature CAD 07/26/2012    Priority: High   Chronic recurrent major depressive disorder (HCC) 09/09/2011    Priority: High   Retinal detachment  12/31/2011    Priority: Medium   History of atrial flutter 09/29/2011    Priority: Medium   Atrophic vaginitis 06/03/2018    Priority: Low   AR (allergic rhinitis) 08/01/2010    Priority: Low   No outpatient medications have been marked as taking for the 09/21/18 encounter (Office Visit) with Willow Ora, MD.    Allergies: Patient is allergic to wellbutrin [bupropion]; aripiprazole; and statins. Family History: Patient family history includes Coronary artery disease in her brother and father; Hypertension in her mother; Stroke in her mother. Social History:  Patient  reports that she quit smoking about 17 years ago. She has never used smokeless tobacco. She reports current alcohol use of about 2.0 standard drinks of alcohol per week. She reports that she does not use drugs.  Review of Systems: Constitutional: Negative for fever malaise or anorexia Cardiovascular: negative for chest pain Respiratory: negative for SOB or persistent cough Gastrointestinal: negative for abdominal pain  OBJECTIVE Vitals: BP (!) 143/87    Pulse 85    Wt 195 lb (88.5 kg)    BMI 31.47 kg/m  General: no acute distress , A&Ox3  Willow Ora, MD

## 2018-09-22 ENCOUNTER — Ambulatory Visit: Payer: BC Managed Care – PPO | Admitting: Family Medicine

## 2018-09-22 ENCOUNTER — Encounter: Payer: Self-pay | Admitting: Family Medicine

## 2018-09-22 VITALS — BP 114/76 | HR 80 | Temp 98.8°F | Resp 16 | Ht 66.0 in | Wt 197.8 lb

## 2018-09-22 DIAGNOSIS — R0789 Other chest pain: Secondary | ICD-10-CM

## 2018-09-22 DIAGNOSIS — Z8249 Family history of ischemic heart disease and other diseases of the circulatory system: Secondary | ICD-10-CM

## 2018-09-22 DIAGNOSIS — F430 Acute stress reaction: Secondary | ICD-10-CM | POA: Diagnosis not present

## 2018-09-22 DIAGNOSIS — R030 Elevated blood-pressure reading, without diagnosis of hypertension: Secondary | ICD-10-CM

## 2018-09-22 NOTE — Progress Notes (Signed)
Subjective  CC:  Chief Complaint  Patient presents with  . Elevated blood pressure reading without diagnosis of hyperte  . Stress  . Atypical Chest Pain    HPI: [name] [name] is a 58 y.o. female who presents to the office today to address the problems listed above in the chief complaint.  See note from virtual visit from yesterday pm. Pt still with "heaviness" in the chest. It has been constant for almost a week now and started after feeling "dehydrated" when she became overheated while painting her garage. She had lightheadedness and dry mouth at that time, no chest pain, nausea, diaphoresis or SOB. She went in the house to drink and rest. Over the weekend, she remained fatigued and noted a heaviness in the chest that has persisted. She took her bp and noted it to be high. She doesn't have a dx of HTN. She worries about her heart due to strong FH of CAD in father and brother with premature deaths. Nothing except valium has made the sxs improve. Nothing has worsened the sxs. No associated sxs. She does admit to feeling some stress due to covid-19 and now with poor sleep: can't get to sleep due to worry about the well being of her children. Her chronic depression is actually doing well. No panic attacks. No pleuritic cp, gerd sxs, calf pain or swelling.    Assessment  1. Atypical chest pain   2. Elevated blood pressure reading without diagnosis of hypertension   3. Stress reaction   4. Family history of premature CAD      Plan   Atypical chest pain in patient with risk factors:  Could all be related to anxiety but unclear. May warrant stress testing. Will get set up with cardiology for further input.   Treat with valium to see if relieves sxs in the meantime.   Discussed red flags.   Follow up: No follow-ups on file.  Visit date not found  Orders Placed This Encounter  Procedures  . DG Chest 2 View  . EKG 12-Lead   No orders of the defined types were placed in this encounter.     I reviewed the patients updated PMH, FH, and SocHx.    Patient Active Problem List   Diagnosis Date Noted  . Tardive dyskinesia 06/03/2018    Priority: High  . 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 Obesity (BMI 30-39.9) 07/27/2012    Priority: High  . Family history of premature CAD 07/26/2012    Priority: High  . Chronic recurrent major depressive disorder (HCC) 09/09/2011    Priority: High  . Retinal detachment 12/31/2011    Priority: Medium  . History of atrial flutter 09/29/2011    Priority: Medium  . Atrophic vaginitis 06/03/2018    Priority: Low  . AR (allergic rhinitis) 08/01/2010    Priority: Low   Current Meds  Medication Sig  . aspirin 81 MG EC tablet Take by mouth.  Marland Kitchen. b complex vitamins tablet Take 1 tablet by mouth daily.  . cholecalciferol (VITAMIN D) 1000 units tablet Take 1,000 Units by mouth daily.  . diazepam (VALIUM) 2 MG tablet   . ezetimibe (ZETIA) 10 MG tablet TAKE 1 TABLET(10 MG) BY MOUTH DAILY  . magnesium 30 MG tablet Take 30 mg by mouth daily.  . Multiple Vitamin (MULTIVITAMIN) tablet Take 1 tablet by mouth daily.  . rosuvastatin (CRESTOR) 5 MG tablet TAKE 1 TABLET(5 MG) BY MOUTH AT BEDTIME  . venlafaxine XR (  EFFEXOR-XR) 75 MG 24 hr capsule 1 capsule daily.    Allergies: Patient is allergic to wellbutrin [bupropion]; aripiprazole; and statins. Family History: Patient family history includes Coronary artery disease in her brother and father; Hypertension in her mother; Stroke in her mother. Social History:  Patient  reports that she quit smoking about 17 years ago. She has never used smokeless tobacco. She reports current alcohol use of about 2.0 standard drinks of alcohol per week. She reports that she does not use drugs.  Review of Systems: Constitutional: Negative for fever malaise or anorexia Cardiovascular: negative for chest pain Respiratory: negative for SOB or persistent cough Gastrointestinal: negative for  abdominal pain  Objective  Vitals: BP 114/76   Pulse 80   Temp 98.8 F (37.1 C) (Oral)   Resp 16   Ht 5\' 6"  (1.676 m)   Wt 197 lb 12.8 oz (89.7 kg)   SpO2 98%   BMI 31.93 kg/m  General: no acute distress , A&Ox3 HEENT: PEERL, conjunctiva normal, Oropharynx moist,neck is supple Cardiovascular:  RRR without murmur or gallop.  Respiratory:  Good breath sounds bilaterally, CTAB with normal respiratory effort Skin:  Warm, no rashes  EKG: nl Sinus rhythm, normal ekg. Compared to 2013. No changes.     Commons side effects, risks, benefits, and alternatives for medications and treatment plan prescribed today were discussed, and the patient expressed understanding of the given instructions. Patient is instructed to call or message via MyChart if he/she has any questions or concerns regarding our treatment plan. No barriers to understanding were identified. We discussed Red Flag symptoms and signs in detail. Patient expressed understanding regarding what to do in case of urgent or emergency type symptoms.   Medication list was reconciled, printed and provided to the patient in AVS. Patient instructions and summary information was reviewed with the patient as documented in the AVS. This note was prepared with assistance of Dragon voice recognition software. Occasional wrong-word or sound-a-like substitutions may have occurred due to the inherent limitations of voice recognition software

## 2018-09-22 NOTE — Patient Instructions (Addendum)
Please follow up if symptoms do not improve or as needed.  We will call you with information regarding your referral appointment. Cardiology.  If you do not hear from [place] within the next 2 weeks, please let me know. It can take 1-2 weeks to get appointments set up with the specialists.     Stress Stress is a normal reaction to life events. Stress is what you feel when life demands more than you are used to, or more than you think you can handle. Some stress can be useful, such as studying for a test or meeting a deadline at work. Stress that occurs too often or for too long can cause problems. It can affect your emotional health and interfere with relationships and normal daily activities. Too much stress can weaken your body's defense system (immune system) and increase your risk for physical illness. If you already have a medical problem, stress can make it worse. What are the causes? All sorts of life events can cause stress. An event that causes stress for one person may not be stressful for another person. Major life events, whether positive or negative, commonly cause stress. Examples include:  Losing a job or starting a new job.  Losing a loved one.  Moving to a new town or home.  Getting married or divorced.  Having a baby.  Injury or illness. Less obvious life events can also cause stress, especially if they occur day after day or in combination with each other. Examples include:  Working long hours.  Driving in traffic.  Caring for children.  Being in debt.  Being in a difficult relationship. What are the signs or symptoms? Stress can cause emotional symptoms, including:  Anxiety. This is feeling worried, afraid, on edge, overwhelmed, or out of control.  Anger, including irritation or impatience.  Depression. This is feeling sad, down, helpless, or guilty.  Trouble focusing, remembering, or making decisions. Stress can cause physical symptoms, including:  Aches  and pains. These may affect your head, neck, back, stomach, or other areas of your body.  Tight muscles or a clenched jaw.  Low energy.  Trouble sleeping. Stress can cause unhealthy behaviors, including:  Eating to feel better (overeating) or skipping meals.  Working too much or putting off tasks.  Smoking, drinking alcohol, or using drugs to feel better. How is this diagnosed? Stress is diagnosed through an assessment by your health care provider. He or she may diagnose this condition based on:  Your symptoms and any stressful life events.  Your medical history.  Tests to rule out other causes of your symptoms. Depending on your condition, your health care provider may refer you to a specialist for further evaluation. How is this treated?  Stress management techniques are the recommended treatment for stress. Medicine is not typically recommended for the treatment of stress. Techniques to reduce your reaction to stressful life events include:  Stress identification. Monitor yourself for symptoms of stress and identify what causes stress for you. These skills may help you to avoid or prepare for stressful events.  Time management. Set your priorities, keep a calendar of events, and learn to say "no." Taking these actions can help you avoid making too many commitments. Techniques for coping with stress include:  Rethinking the problem. Try to think realistically about stressful events rather than ignoring them or overreacting. Try to find the positives in a stressful situation rather than focusing on the negatives.  Exercise. Physical exercise can release both physical and emotional  tension. The key is to find a form of exercise that you enjoy and do it regularly.  Relaxation techniques. These relax the body and mind. The key is to find one or more that you enjoy and use the technique(s) regularly. Examples include: ? Meditation, deep breathing, or progressive relaxation  techniques. ? Yoga or tai chi. ? Biofeedback, mindfulness techniques, or journaling. ? Listening to music, being out in nature, or participating in other hobbies.  Practicing a healthy lifestyle. Eat a balanced diet, drink plenty of water, limit or avoid caffeine, and get plenty of sleep.  Having a strong support network. Spend time with family, friends, or other people you enjoy being around. Express your feelings and talk things over with someone you trust. Counseling or talk therapy with a mental health professional may be helpful if you are having trouble managing stress on your own. Follow these instructions at home: Lifestyle   Avoid drugs.  Do not use any products that contain nicotine or tobacco, such as cigarettes and e-cigarettes. If you need help quitting, ask your health care provider.  Limit alcohol intake to no more than 1 drink a day for nonpregnant women and 2 drinks a day for men. One drink equals 12 oz of beer, 5 oz of wine, or 1 oz of hard liquor.  Do not use alcohol or drugs to relax.  Eat a balanced diet that includes fresh fruits and vegetables, whole grains, lean meats, fish, eggs, and beans, and low-fat dairy. Avoid processed foods and foods high in added fat, sugar, and salt.  Exercise at least 30 minutes on 5 or more days each week.  Get 7-8 hours of sleep each night. General instructions   Practice stress management techniques as discussed with your health care provider.  Drink enough fluid to keep your urine clear or pale yellow.  Take over-the-counter and prescription medicines only as told by your health care provider.  Keep all follow-up visits as told by your health care provider. This is important. Contact a health care provider if:  Your symptoms get worse.  You have new symptoms.  You feel overwhelmed by your problems and can no longer manage them on your own. Get help right away if:  You have thoughts of hurting yourself or others. If  you ever feel like you may hurt yourself or others, or have thoughts about taking your own life, get help right away. You can go to your nearest emergency department or call:  Your local emergency services (911 in the [place]  A suicide crisis helpline, such as the [facility] at 772 259 2746. This is open 24 hours a day. Summary  Stress is a normal reaction to life events. It can cause problems if it happens too often or for too long.  Practicing stress management techniques is the best way to treat stress.  Counseling or talk therapy with a mental health professional may be helpful if you are having trouble managing stress on your own. This information is not intended to replace advice given to you by your health care provider. Make sure you discuss any questions you have with your health care provider. Document Released: 10/14/2000 Document Revised: 06/10/2016 Document Reviewed: 06/10/2016 Elsevier Interactive Patient Education  2019 [employer].

## 2018-12-26 ENCOUNTER — Encounter: Payer: Self-pay | Admitting: Family Medicine

## 2018-12-26 MED ORDER — TRAMADOL HCL 50 MG PO TABS
50.0000 mg | ORAL_TABLET | Freq: Three times a day (TID) | ORAL | 0 refills | Status: AC | PRN
Start: 2018-12-26 — End: 2018-12-31

## 2018-12-26 MED ORDER — VALACYCLOVIR HCL 1 G PO TABS: 1000.0000 mg | ORAL_TABLET | Freq: Three times a day (TID) | ORAL | 0 refills | Status: DC

## 2018-12-28 ENCOUNTER — Other Ambulatory Visit: Payer: Self-pay | Admitting: Family Medicine

## 2018-12-28 DIAGNOSIS — B029 Zoster without complications: Secondary | ICD-10-CM

## 2018-12-28 MED ORDER — HYDROCODONE-ACETAMINOPHEN 5-325 MG PO TABS: 1.0000 | ORAL_TABLET | Freq: Four times a day (QID) | ORAL | 0 refills | Status: DC | PRN

## 2018-12-29 ENCOUNTER — Other Ambulatory Visit: Payer: Self-pay | Admitting: Family Medicine

## 2018-12-29 MED ORDER — PREDNISONE 10 MG PO TABS: ORAL_TABLET | ORAL | 0 refills | Status: DC

## 2018-12-29 NOTE — Telephone Encounter (Signed)
Spoke with Jeani Hawking . norco lasting for about 20 minutes. Pain is still an 8. No fevers.

## 2019-01-16 ENCOUNTER — Telehealth (INDEPENDENT_AMBULATORY_CARE_PROVIDER_SITE_OTHER): Payer: BC Managed Care – PPO | Admitting: Cardiology

## 2019-01-16 ENCOUNTER — Encounter: Payer: Self-pay | Admitting: Cardiology

## 2019-01-16 VITALS — BP 123/78 | HR 103 | Ht 66.0 in | Wt 201.0 lb

## 2019-01-16 DIAGNOSIS — R0789 Other chest pain: Secondary | ICD-10-CM | POA: Insufficient documentation

## 2019-01-16 DIAGNOSIS — Z8679 Personal history of other diseases of the circulatory system: Secondary | ICD-10-CM

## 2019-01-16 DIAGNOSIS — Z8249 Family history of ischemic heart disease and other diseases of the circulatory system: Secondary | ICD-10-CM | POA: Diagnosis not present

## 2019-01-16 DIAGNOSIS — Z7189 Other specified counseling: Secondary | ICD-10-CM

## 2019-01-16 NOTE — Patient Instructions (Signed)

## 2019-01-16 NOTE — Progress Notes (Signed)
Virtual Visit via Video Note   This visit type was conducted due to national recommendations for restrictions regarding the COVID-19 Pandemic (e.g. social distancing) in an effort to limit this patient's exposure and mitigate transmission in our community.  Due to her co-morbid illnesses, this patient is at least at moderate risk for complications without adequate follow up.  This format is felt to be most appropriate for this patient at this time.  All issues noted in this document were discussed and addressed.  A limited physical exam was performed with this format.  Please refer to the patient's chart for her consent to telehealth for [facility].   Date:  01/16/2019   ID:  [name] [name], DOB [birth year], MRN [id number]  Patient Location: Home Provider Location: Home  PCP:  Willow Ora, MD  Cardiologist:  Jodelle Red, MD  Electrophysiologist:  None   Evaluation Performed:  Consultation - [name] [name] was referred by Dr. Mardelle Matte for the evaluation of chest pain.  Chief Complaint:  Chest discomfort  History of Present Illness:    [name] [name] is a 58 y.o. female with tardive dyskinesia (2/2 meds from depression treatment), anxiety, HLD, obesity  The patient does not have symptoms concerning for COVID-19 infection (fever, chills, cough, or new shortness of breath).   Chest pain: -Initial onset: started in 09/2018. Wasn't sure of the etiology. Psychiatrist put her on valium PRN, hasn't needed since June -Quality/frequency/duration: racing heart, "impending doom", felt constant. Couldn't sleep, felt constantly anxious.  -Associated symptoms: recently had shingles (4 weeks) -Aggravating/alleviating factors: better with stopping watching the news, better with walking.  -Prior cardiac history: atrial flutter in Sep 08, 2011, s/p two TEE/CV, no issues since -Prior ECG: NSR -Prior workup: stress test in 09-07-1997 after brother died, normal -Prior treatment: none -Alcohol: 1 drink/week  at most -Tobacco: former smoker, quit many years ago -Comorbidities:  Hyperlipidemia, obesity (BMI 32) -Exercise level: not limited, but getting back to walking -Cardiac ROS: no shortness of breath, no PND, no orthopnea, no LE edema, no syncope -Family history: brother died at age 63 from massive MI. Father had 4 Mis, mother has high BP, had a stroke (taken off blood thinner for a procedure). Not sure why she was on a blood thinner--denies afib. Thinks it was for CHF.    Past Medical History:  Diagnosis Date  . Atrial flutter with rapid ventricular response (HCC) 09/08/2011  . Detached retina   . High cholesterol   . Hyperlipemia 09/08/2011   Past Surgical History:  Procedure Laterality Date  . APPENDECTOMY    . ATRIAL FLUTTER ABLATION N/A 12/15/2011   Procedure: ATRIAL FLUTTER ABLATION;  Surgeon: Marinus Maw, MD;  Location[facility] CATH LAB;  Service: Cardiovascular;  Laterality: N/A;  . CARDIOVERSION  12/04/2011   Procedure: CARDIOVERSION;  Surgeon: Thurmon Fair, MD;  Location: [facility] ENDOSCOPY;  Service: Cardiovascular;  Laterality: N/A;  . TEE WITHOUT CARDIOVERSION  09/09/2011   Procedure: TRANSESOPHAGEAL ECHOCARDIOGRAM (TEE);  Surgeon: Thurmon Fair, MD;  Location: [facility] ENDOSCOPY;  Service: Cardiovascular;  Laterality: N/A;  anes. not able to come fo cardioversion / pt TBA but in ER  . TEE WITHOUT CARDIOVERSION  12/04/2011   Procedure: TRANSESOPHAGEAL ECHOCARDIOGRAM (TEE);  Surgeon: Thurmon Fair, MD;  Location: [facility] ENDOSCOPY;  Service: Cardiovascular;  Laterality: N/A;  . TONSILLECTOMY    . TUBAL LIGATION       Current Meds  Medication Sig  . aspirin 81 MG EC tablet Take 81 mg by mouth daily.  Marland Kitchen b complex  vitamins tablet Take 1 tablet by mouth daily.  . cholecalciferol (VITAMIN D) 1000 units tablet Take 1,000 Units by mouth daily.  . diazepam (VALIUM) 2 MG tablet daily as needed.   . ezetimibe (ZETIA) 10 MG tablet TAKE 1 TABLET(10 MG) BY MOUTH DAILY  . magnesium 30 MG tablet Take 30 mg by  mouth daily.  . Multiple Vitamin (MULTIVITAMIN) tablet Take 1 tablet by mouth daily.  . rosuvastatin (CRESTOR) 5 MG tablet TAKE 1 TABLET(5 MG) BY MOUTH AT BEDTIME  . venlafaxine XR (EFFEXOR-XR) 75 MG 24 hr capsule 1 capsule daily.  . [DISCONTINUED] HYDROcodone-acetaminophen (NORCO) 5-325 MG tablet Take 1 tablet by mouth every 6 (six) hours as needed for moderate pain.     Allergies:   Wellbutrin [bupropion], Aripiprazole, and Statins   Social History   Tobacco Use  . Smoking status: Former Smoker    Quit date: 05/04/2001    Years since quitting: 17.7  . Smokeless tobacco: Never Used  Substance Use Topics  . Alcohol use: Yes    Alcohol/week: 2.0 standard drinks    Types: 2 Cans of beer per week  . Drug use: No     Family Hx: The patient's family history includes Coronary artery disease in her brother and father; Hypertension in her mother; Stroke in her mother.  ROS:   Please see the history of present illness.    Constitutional: Negative for chills, fever, night sweats, unintentional weight loss  HENT: Negative for ear pain and hearing loss.   Eyes: Negative for loss of vision and eye pain.  Respiratory: Negative for cough, sputum, wheezing.   Cardiovascular: See HPI. Gastrointestinal: Negative for abdominal pain, melena, and hematochezia.  Genitourinary: Negative for dysuria and hematuria.  Musculoskeletal: Negative for falls and myalgias.  Skin: Negative for itching, did have rash with the shingles.  Neurological: Negative for focal weakness, focal sensory changes and loss of consciousness.  Endo/Heme/Allergies: Does not bruise/bleed easily.  All other systems reviewed and are negative.   Prior CV studies:   The following studies were reviewed today: TEE 12-Sep-2011  Labs/Other Tests and Data Reviewed:    EKG:  An ECG dated 09/2018 was personally reviewed today and demonstrated:  NSR  Recent Labs: 06/03/2018: ALT 16; BUN 13; Creatinine, Ser 0.79; Hemoglobin 14.3; Platelets  269.0; Potassium 4.0; Sodium 140; TSH 1.59   Recent Lipid Panel Lab Results  Component Value Date/Time   CHOL 197 06/03/2018 08:52 AM   TRIG 116.0 06/03/2018 08:52 AM   HDL 47.20 06/03/2018 08:52 AM   CHOLHDL 4 06/03/2018 08:52 AM   LDLCALC 126 (H) 06/03/2018 08:52 AM   LDLDIRECT 109.0 04/30/2017 02:51 PM    Wt Readings from Last 3 Encounters:  01/16/19 201 lb (91.2 kg)  09/22/18 197 lb 12.8 oz (89.7 kg)  09/21/18 195 lb (88.5 kg)     Objective:    Vital Signs:  BP 123/78   Pulse (!) 103   Ht 5\' 6"  (1.676 m)   Wt 201 lb (91.2 kg)   BMI 32.44 kg/m    VITAL SIGNS:  reviewed GEN:  no acute distress EYES:  sclerae anicteric, EOMI - Extraocular Movements Intact RESPIRATORY:  normal respiratory effort, symmetric expansion CARDIOVASCULAR:  no visible JVD SKIN:  no rash, lesions or ulcers. MUSCULOSKELETAL:  no obvious deformities. NEURO:  alert and oriented x 3, no obvious focal deficit PSYCH:  normal affect  ASSESSMENT & PLAN:    Chest pain: now resolved. With family history, if it recurs, I would  do further testing. -discussed  CT coronary angiography today, which would be my recommendation if chest pain recurs -also discussed calcium score, though this is primarily used for decision making re: statin/prevention. As she is already on these medications, less utility for calcium score (unless score=0)  Family history of premature CAD, general heart disease: brother died of massive MI at age 59.   History of atrial flutter: denies recurrence since 2013.  CV risk counseling and prevention: -recommend heart healthy/Mediterranean diet, with whole grains, fruits, vegetable, fish, lean meats, nuts, and olive oil. Limit salt. -recommend moderate walking, 3-5 times/week for 30-50 minutes each session. Aim for at least 150 minutes.week. Goal should be pace of 3 miles/hours, or walking 1.5 miles in 30 minutes -recommend avoidance of tobacco products. Avoid excess  alcohol. -Additional risk factor control:  -Diabetes: A1c is not available, denies history  -Lipids: 06/03/18: Tchol 197, TG 116, HDL 47, LDL 126. Remote LDL 186. Would aim for LDL 100 with her family history  -Blood pressure control: at goal  -Weight: BMI 32, working on increasing activity -ASCVD risk score: family history not included in this calculator The 10-year ASCVD risk score Denman George(Goff DC Jr., et al., 2013) is: 3%   Values used to calculate the score:     Age: 89 years     Sex: Female     Is Non-Hispanic African American: No     Diabetic: No     Tobacco smoker: No     Systolic Blood Pressure: 123 mmHg     Is BP treated: No     HDL Cholesterol: 47.2 mg/dL     Total Cholesterol: 197 mg/dL   [id number] Education: The signs and symptoms of COVID-19 were discussed with the patient and how to seek care for testing (follow up with PCP or arrange E-visit).  The importance of social distancing was discussed today.  Time:   Today, I have spent 25 minutes with the patient with telehealth technology discussing the above problems.  I spent an additional 15 minutes in preparation for visit reviewing prior notes and procedure history.   Medication Adjustments/Labs and Tests Ordered: Current medicines are reviewed at length with the patient today.  Concerns regarding medicines are outlined above.   Patient Instructions  Medication Instructions:  Your Physician recommend you continue on your current medication as directed.    If you need a refill on your cardiac medications before your next appointment, please call your pharmacy.   Lab work: None  Testing/Procedures: None  Follow-Up: At [facility], you and your health needs are our priority.  As part of our continuing mission to provide you with exceptional heart care, we have created designated Provider Care Teams.  These Care Teams include your primary Cardiologist (physician) and Advanced Practice Providers (APPs -  Physician  Assistants and Nurse Practitioners) who all work together to provide you with the care you need, when you need it. You will need a follow up appointment in 1 years.  Please call our office 2 months in advance to schedule this appointment.  You may see Jodelle RedBridgette Davette Nugent, MD or one of the following Advanced Practice Providers on your designated Care Team:   Theodore DemarkRhonda Barrett, PA-C . Joni ReiningKathryn Lawrence, DNP, ANP    Follow Up:  1 year or sooner PRN if chest pain recurs  Signed, Jodelle RedBridgette Amore Ackman, MD  01/16/2019 10:57 AM    [facility]

## 2019-01-27 ENCOUNTER — Ambulatory Visit: Payer: BC Managed Care – PPO

## 2019-01-30 ENCOUNTER — Ambulatory Visit (INDEPENDENT_AMBULATORY_CARE_PROVIDER_SITE_OTHER): Payer: BC Managed Care – PPO | Admitting: Family Medicine

## 2019-01-30 ENCOUNTER — Other Ambulatory Visit: Payer: Self-pay

## 2019-01-30 ENCOUNTER — Encounter: Payer: Self-pay | Admitting: Family Medicine

## 2019-01-30 VITALS — BP 124/80 | HR 93 | Temp 98.0°F | Resp 16 | Ht 64.0 in | Wt 203.6 lb

## 2019-01-30 DIAGNOSIS — Z111 Encounter for screening for respiratory tuberculosis: Secondary | ICD-10-CM | POA: Diagnosis not present

## 2019-01-30 DIAGNOSIS — Z23 Encounter for immunization: Secondary | ICD-10-CM

## 2019-01-30 DIAGNOSIS — Z021 Encounter for pre-employment examination: Secondary | ICD-10-CM

## 2019-01-30 DIAGNOSIS — E669 Obesity, unspecified: Secondary | ICD-10-CM

## 2019-01-30 DIAGNOSIS — F339 Major depressive disorder, recurrent, unspecified: Secondary | ICD-10-CM

## 2019-01-30 DIAGNOSIS — Z8249 Family history of ischemic heart disease and other diseases of the circulatory system: Secondary | ICD-10-CM

## 2019-01-30 NOTE — Progress Notes (Signed)
Subjective  CC:  Chief Complaint  Patient presents with  . Exam Form Completion    Needs form completed, flu shot, and TB skin test    HPI: [name] [name] is a 58 y.o. female who presents to the office today to address the problems listed above in the chief complaint.  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 Excited! Feels well. Needs preemployment exam/labs/screens. See form.  Atypical cp: reviewed recent cards eval: no further episodes so no testing ordered; however, IF recurs, recs cardiac CT. Pt aware and agrees. Seems to have been stress related.   Mood is good.  Stable on meds.  Assessment  1. Encounter for pre-employment examination   2. Family history of premature CAD   3. Screening-pulmonary TB   4. Obesity (BMI 30-39.9)   5. Chronic recurrent major depressive disorder ([place])      Plan   Completed preemployment forms:  Cleared for work  Flu shot today  Work on diet and exercise  Mood is stable.   Follow up: Return in about 4 months (around 06/01/2019) for complete physical.  06/05/2019  Orders Placed This Encounter  Procedures  . QuantiFERON-TB Gold Plus   No orders of the defined types were placed in this encounter.     I reviewed the patients updated PMH, FH, and SocHx.    Patient Active Problem List   Diagnosis Date Noted  . Tardive dyskinesia 06/03/2018    Priority: High  . 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 Obesity (BMI 30-39.9) 07/27/2012    Priority: High  . Family history of premature CAD 07/26/2012    Priority: High  . Chronic recurrent major depressive disorder ([place]) 09/09/2011    Priority: High  . Retinal detachment 12/31/2011    Priority: Medium  . History of atrial flutter 09/29/2011    Priority: Medium  . Atrophic vaginitis 06/03/2018    Priority: Low  . AR (allergic rhinitis) 08/01/2010    Priority: Low   Current Meds  Medication Sig  . aspirin 81 MG EC tablet Take 81 mg by mouth  daily.  Marland Kitchen b complex vitamins tablet Take 1 tablet by mouth daily.  . cholecalciferol (VITAMIN D) 1000 units tablet Take 1,000 Units by mouth daily.  . diazepam (VALIUM) 2 MG tablet daily as needed.   . ezetimibe (ZETIA) 10 MG tablet TAKE 1 TABLET(10 MG) BY MOUTH DAILY  . magnesium 30 MG tablet Take 30 mg by mouth daily.  . Multiple Vitamin (MULTIVITAMIN) tablet Take 1 tablet by mouth daily.  . Omega-3 Fatty Acids (FISH OIL) 1000 MG CAPS Take by mouth.  . rosuvastatin (CRESTOR) 5 MG tablet TAKE 1 TABLET(5 MG) BY MOUTH AT BEDTIME  . venlafaxine XR (EFFEXOR-XR) 75 MG 24 hr capsule 1 capsule daily.    Allergies: Patient is allergic to wellbutrin [bupropion]; aripiprazole; and statins. Family History: Patient family history includes Coronary artery disease in her brother and father; Hypertension in her mother; Stroke in her mother. Social History:  Patient  reports that she quit smoking about 17 years ago. She has never used smokeless tobacco. She reports current alcohol use of about 2.0 standard drinks of alcohol per week. She reports that she does not use drugs.  Review of Systems: Constitutional: Negative for fever malaise or anorexia Cardiovascular: negative for chest pain Respiratory: negative for SOB or persistent cough Gastrointestinal: negative for abdominal pain  Objective  Vitals: BP 124/80   Pulse 93   Temp  98 F (36.7 C) (Tympanic)   Resp 16   Ht 5\' 4"  (1.626 m)   Wt 203 lb 9.6 oz (92.4 kg)   SpO2 95%   BMI 34.95 kg/m  General: no acute distress , A&Ox3 HEENT: PEERL, conjunctiva normal, Oropharynx moist,neck is supple Cardiovascular:  RRR without murmur or gallop.  Respiratory:  Good breath sounds bilaterally, CTAB with normal respiratory effort Skin:  Warm, no rashes     Commons side effects, risks, benefits, and alternatives for medications and treatment plan prescribed today were discussed, and the patient expressed understanding of the given instructions.  Patient is instructed to call or message via MyChart if he/she has any questions or concerns regarding our treatment plan. No barriers to understanding were identified. We discussed Red Flag symptoms and signs in detail. Patient expressed understanding regarding what to do in case of urgent or emergency type symptoms.   Medication list was reconciled, printed and provided to the patient in AVS. Patient instructions and summary information was reviewed with the patient as documented in the AVS. This note was prepared with assistance of Dragon voice recognition software. Occasional wrong-word or sound-a-like substitutions may have occurred due to the inherent limitations of voice recognition software

## 2019-01-30 NOTE — Patient Instructions (Addendum)
Please return in January 2021 for your annual complete physical; please come fasting.  Today you were given your flu vaccination.   If you have any questions or concerns, please don't hesitate to send me a message via MyChart or call the office at [phone]. Thank you for visiting with [place] today! It's our pleasure caring for you.

## 2019-02-01 LAB — QUANTIFERON-TB GOLD PLUS
Mitogen-NIL: >10 IU/mL
NIL: 0.02 IU/mL
QuantiFERON-TB Gold Plus: NEGATIVE
TB1-NIL: <0 IU/mL
TB2-NIL: 0 IU/mL

## 2019-02-03 ENCOUNTER — Encounter: Payer: Self-pay | Admitting: *Deleted

## 2019-02-03 NOTE — Progress Notes (Signed)
Please call patient: I have reviewed his/her lab results. Neg TB screen. Complete form and call pt for pick up or fax. Thanks.

## 2019-06-05 ENCOUNTER — Other Ambulatory Visit: Payer: Self-pay

## 2019-06-05 ENCOUNTER — Encounter: Payer: BC Managed Care – PPO | Admitting: Family Medicine

## 2019-06-06 ENCOUNTER — Ambulatory Visit (INDEPENDENT_AMBULATORY_CARE_PROVIDER_SITE_OTHER): Payer: BC Managed Care – PPO | Admitting: Family Medicine

## 2019-06-06 ENCOUNTER — Encounter: Payer: Self-pay | Admitting: Family Medicine

## 2019-06-06 VITALS — BP 122/84 | HR 73 | Temp 96.4°F | Ht 64.0 in | Wt 205.8 lb

## 2019-06-06 DIAGNOSIS — Z8249 Family history of ischemic heart disease and other diseases of the circulatory system: Secondary | ICD-10-CM

## 2019-06-06 DIAGNOSIS — Z0000 Encounter for general adult medical examination without abnormal findings: Secondary | ICD-10-CM

## 2019-06-06 DIAGNOSIS — Z1231 Encounter for screening mammogram for malignant neoplasm of breast: Secondary | ICD-10-CM

## 2019-06-06 DIAGNOSIS — E7801 Familial hypercholesterolemia: Secondary | ICD-10-CM

## 2019-06-06 DIAGNOSIS — F339 Major depressive disorder, recurrent, unspecified: Secondary | ICD-10-CM

## 2019-06-06 DIAGNOSIS — E669 Obesity, unspecified: Secondary | ICD-10-CM

## 2019-06-06 DIAGNOSIS — G2401 Drug induced subacute dyskinesia: Secondary | ICD-10-CM

## 2019-06-06 DIAGNOSIS — B0229 Other postherpetic nervous system involvement: Secondary | ICD-10-CM

## 2019-06-06 LAB — CBC WITH DIFFERENTIAL/PLATELET
Basophils Absolute: 0 10*3/uL (ref 0.0–0.1)
Basophils Relative: 0.1 % (ref 0.0–3.0)
Eosinophils Absolute: 0.2 10*3/uL (ref 0.0–0.7)
Eosinophils Relative: 2.9 % (ref 0.0–5.0)
HCT: 42.1 % (ref 36.0–46.0)
Hemoglobin: 13.9 g/dL (ref 12.0–15.0)
Lymphocytes Relative: 21.7 % (ref 12.0–46.0)
Lymphs Abs: 1.3 10*3/uL (ref 0.7–4.0)
MCHC: 33.1 g/dL (ref 30.0–36.0)
MCV: 89.9 fl (ref 78.0–100.0)
Monocytes Absolute: 0.5 10*3/uL (ref 0.1–1.0)
Monocytes Relative: 8.1 % (ref 3.0–12.0)
Neutro Abs: 3.9 10*3/uL (ref 1.4–7.7)
Neutrophils Relative %: 67.2 % (ref 43.0–77.0)
Platelets: 284 10*3/uL (ref 150.0–400.0)
RBC: 4.68 Mil/uL (ref 3.87–5.11)
RDW: 13.3 % (ref 11.5–15.5)
WBC: 5.8 10*3/uL (ref 4.0–10.5)

## 2019-06-06 LAB — COMPREHENSIVE METABOLIC PANEL
ALT: 16 U/L (ref 0–35)
AST: 17 U/L (ref 0–37)
Albumin: 4.5 g/dL (ref 3.5–5.2)
Alkaline Phosphatase: 69 U/L (ref 39–117)
BUN: 15 mg/dL (ref 6–23)
CO2: 31 mEq/L (ref 19–32)
Calcium: 9.8 mg/dL (ref 8.4–10.5)
Chloride: 103 mEq/L (ref 96–112)
Creatinine, Ser: 0.82 mg/dL (ref 0.40–1.20)
GFR: 71.21 mL/min (ref >60.00)
Glucose, Bld: 94 mg/dL (ref 70–99)
Potassium: 4.5 mEq/L (ref 3.5–5.1)
Sodium: 141 mEq/L (ref 135–145)
Total Bilirubin: 0.5 mg/dL (ref 0.2–1.2)
Total Protein: 6.6 g/dL (ref 6.0–8.3)

## 2019-06-06 LAB — LIPID PANEL
Cholesterol: 197 mg/dL (ref 0–200)
HDL: 53.8 mg/dL (ref >39.00)
LDL Cholesterol: 114 mg/dL — ABNORMAL HIGH (ref 0–99)
NonHDL: 143.27
Total CHOL/HDL Ratio: 4
Triglycerides: 145 mg/dL (ref 0.0–149.0)
VLDL: 29 mg/dL (ref 0.0–40.0)

## 2019-06-06 LAB — TSH: TSH: 1.06 u[IU]/mL (ref 0.35–4.50)

## 2019-06-06 MED ORDER — EZETIMIBE 10 MG PO TABS: 10.0000 mg | ORAL_TABLET | Freq: Every day | ORAL | 3 refills | Status: DC

## 2019-06-06 MED ORDER — ROSUVASTATIN CALCIUM 5 MG PO TABS: ORAL_TABLET | ORAL | 3 refills | Status: DC

## 2019-06-06 NOTE — Progress Notes (Signed)
Subjective  Chief Complaint  Patient presents with  . Annual Exam    Patient is fasting. Due for mammogram.  . Depression    Doing ok. Patinet has her ups and downs, it's steady.    HPI: [name] [name] is a 59 y.o. female who presents to [facility] today for a Female Wellness Visit. She also has the concerns and/or needs as listed above in the chief complaint. These will be addressed in addition to the Health Maintenance Visit.   Wellness Visit: annual visit with health maintenance review and exam without Pap   HM: due mammogram. imms are up to date. Working on weight loss but overeats/indulges often. Started walking daily with wife. Pap and crc screens up to date Chronic disease f/u and/or acute problem visit: (deemed necessary to be done in addition to the wellness visit):  HLD on zetia and low dose crestor doing well; tolerating well. Fasting today for labs  Depression: much improved. Back on effexor and prn valium. 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  Tardive dyskinesia: improving.   C/o burning pain at site of where she had shingles; intermittent. Relieved with advil. No new rash.   ROS prn headaches related to barometric changes in weather. Relieved with excedrin  Assessment  1. Annual physical exam   2. Familial hypercholesteremia   3. Obesity (BMI 30-39.9)   4. Family history of premature CAD   5. Chronic recurrent major depressive disorder (HCC)   6. Tardive dyskinesia   7. Encounter for screening mammogram for breast cancer   8. Postherpetic neuralgia      Plan  Female Wellness Visit:  Age appropriate Health Maintenance and Prevention measures were discussed with patient. Included topics are cancer screening recommendations, ways to keep healthy (see AVS) including dietary and exercise recommendations, regular eye and dental care, use of seat belts, and avoidance of moderate alcohol use and tobacco use. mammo  ordered at [facility]  BMI: discussed patient's BMI and encouraged positive lifestyle modifications to help get to or maintain a target BMI.  HM needs and immunizations were addressed and ordered. See below for orders. See HM and immunization section for updates. utd  Routine labs and screening tests ordered including cmp, cbc and lipids where appropriate.  Discussed recommendations regarding Vit D and calcium supplementation (see AVS)  Chronic disease management visit and/or acute problem visit:  HLD: recheck today. Monitor lfts. Discussed recommended diet changes. Refilled meds today  Postherpetic neuralgia: educated. Mild sxs. advil prn  Depression and TD are stable. Continue effexor  Follow up: Return in about 1 year (around 06/05/2020) for complete physical.  Orders Placed This Encounter  Procedures  . MM 3D SCREEN BREAST BILATERAL  . CBC with Differential/Platelet  . Comprehensive metabolic panel  . Lipid panel  . TSH   Meds ordered this encounter  Medications  . rosuvastatin (CRESTOR) 5 MG tablet    Sig: TAKE 1 TABLET(5 MG) BY MOUTH AT BEDTIME    Dispense:  90 tablet    Refill:  3  . ezetimibe (ZETIA) 10 MG tablet    Sig: Take 1 tablet (10 mg total) by mouth at bedtime.    Dispense:  90 tablet    Refill:  3      Lifestyle: Body mass index is 35.33 kg/m. Wt Readings from Last 3 Encounters:  06/06/19 205 lb 12.8 oz (93.4 kg)  01/30/19 203 lb 9.6 oz (92.4 kg)  01/16/19 201 lb (  91.2 kg)    Patient Active Problem List   Diagnosis Date Noted  . Tardive dyskinesia 06/03/2018    Priority: High  . 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 Obesity (BMI 30-39.9) 07/27/2012    Priority: High  . Family history of premature CAD 07/26/2012    Priority: High    Brother and father   . Chronic recurrent major depressive disorder ([place]) 09/09/2011    Priority: High    Beryle Lathe, FNP psych; failed long term effexor, abilify and seroquel   .  Postherpetic neuralgia 06/06/2019    Priority: Medium  . Retinal detachment 12/31/2011    Priority: Medium    Overview:  Dr Sherlynn Stalls, retinal specialist   . History of atrial flutter 09/29/2011    Priority: Medium    Overview:  Atrial flutter - s/p cardioversion x 2, s/p ablation 12/2011 [facility], Dr. Gwenlyn Found   . Atrophic vaginitis 06/03/2018    Priority: Low  . AR (allergic rhinitis) 08/01/2010    Priority: Low   Health Maintenance  Topic Date Due  . MAMMOGRAM  05/28/2019  . COLONOSCOPY  03/22/2022  . PAP SMEAR-Modifier  06/04/2023  . TETANUS/TDAP  08/06/2023  . INFLUENZA VACCINE  Completed  . Hepatitis C Screening  Completed  . HIV Screening  Completed   Immunization History  Administered Date(s) Administered  . Influenza,inj,Quad PF,6+ Mos 04/30/2017, 03/04/2018, 01/30/2019  . Tdap 10/07/2010, 08/05/2013   We updated and reviewed the patient's past history in detail and it is documented below. Allergies: Patient is allergic to wellbutrin [bupropion]; aripiprazole; and statins. Past Medical History Patient  has a past medical history of Atrial flutter with rapid ventricular response ([place]) (09/08/2011), Detached retina, High cholesterol, and Hyperlipemia (09/08/2011). Past Surgical History Patient  has a past surgical history that includes Appendectomy; Tonsillectomy; Tubal ligation; TEE without cardioversion (09/09/2011); TEE without cardioversion (12/04/2011); Cardioversion (12/04/2011); and Atrial flutter ablation (N/A, 12/15/2011). Family History: Patient family history includes Coronary artery disease in her brother and father; Hypertension in her mother; Stroke in her mother. Social History:  Patient  reports that she quit smoking about 18 years ago. She has never used smokeless tobacco. She reports current alcohol use of about 2.0 standard drinks of alcohol per week. She reports that she does not use drugs.  Review of Systems: Constitutional: negative for  fever or malaise Ophthalmic: negative for photophobia, double vision or loss of vision Cardiovascular: negative for chest pain, dyspnea on exertion, or new LE swelling Respiratory: negative for SOB or persistent cough Gastrointestinal: negative for abdominal pain, change in bowel habits or melena Genitourinary: negative for dysuria or gross hematuria, no abnormal uterine bleeding or disharge Musculoskeletal: negative for new gait disturbance or muscular weakness Integumentary: negative for new or persistent rashes, no breast lumps Neurological: negative for TIA or stroke symptoms Psychiatric: negative for SI or delusions Allergic/Immunologic: negative for hives  Patient Care Team    Relationship Specialty Notifications Start End  Leamon Arnt, MD PCP - General Family Medicine  04/30/17   Buford Dresser, MD Consulting Physician Cardiology  06/06/19   [place], [place] Physician Psychiatry  06/06/19     Objective  Vitals: BP 122/84 (BP Location: Right Arm, Patient Position: Sitting, Cuff Size: Large)   Pulse 73   Temp (!) 96.4 F (35.8 C) (Temporal)   Ht 5\' 4"  (1.626 m)   Wt 205 lb 12.8 oz (93.4 kg)   SpO2 93%   BMI 35.33 kg/m  General:  Well developed, well nourished, no acute distress  Psych:  Alert and orientedx3,normal mood and affect HEENT:  Normocephalic, atraumatic, non-icteric sclera, PERRL,ssupple neck without adenopathy, mass or thyromegaly Cardiovascular:  Normal S1, S2, RRR without gallop, rub or murmur, nondisplaced PMI Respiratory:  Good breath sounds bilaterally, CTAB with normal respiratory effort Gastrointestinal: normal bowel sounds, soft, non-tender, no noted masses. No HSM MSK: no deformities, contusions. Joints are without erythema or swelling. Spine and CVA region are nontender Skin:  Warm, no rashes or suspicious lesions noted Neurologic:    Mental status is normal. CN 2-11 are normal. Gross motor and sensory exams are normal. Normal  gait. No tremor Breast Exam: No mass, skin retraction or nipple discharge is appreciated in either breast. No axillary adenopathy. Fibrocystic changes are not noted   Commons side effects, risks, benefits, and alternatives for medications and treatment plan prescribed today were discussed, and the patient expressed understanding of the given instructions. Patient is instructed to call or message via MyChart if he/she has any questions or concerns regarding our treatment plan. No barriers to understanding were identified. We discussed Red Flag symptoms and signs in detail. Patient expressed understanding regarding what to do in case of urgent or emergency type symptoms.   Medication list was reconciled, printed and provided to the patient in AVS. Patient instructions and summary information was reviewed with the patient as documented in the AVS. This note was prepared with assistance of Dragon voice recognition software. Occasional wrong-word or sound-a-like substitutions may have occurred due to the inherent limitations of voice recognition software  This visit occurred during the SARS-CoV-2 public health emergency.  Safety protocols were in place, including screening questions prior to the visit, additional usage of staff PPE, and extensive cleaning of exam room while observing appropriate contact time as indicated for disinfecting solutions.

## 2019-06-06 NOTE — Patient Instructions (Signed)
Please return in 12 months for your annual complete physical; please come fasting.  I will release your lab results to you on your MyChart account with further instructions. Please reply with any questions.   We will get you scheduled for your mammogram.   If you have any questions or concerns, please don't hesitate to send me a message via MyChart or call the office at [phone]. Thank you for visiting with [place] today! It's our pleasure caring for you.   Preventive Care 52-50 Years Old, Female Preventive care refers to visits with your health care provider and lifestyle choices that can promote health and wellness. This includes:  A yearly physical exam. This may also be called an annual well check.  Regular dental visits and eye exams.  Immunizations.  Screening for certain conditions.  Healthy lifestyle choices, such as eating a healthy diet, getting regular exercise, not using drugs or products that contain nicotine and tobacco, and limiting alcohol use. What can I expect for my preventive care visit? Physical exam Your health care provider will check your:  Height and weight. This may be used to calculate body mass index (BMI), which tells if you are at a healthy weight.  Heart rate and blood pressure.  Skin for abnormal spots. Counseling Your health care provider may ask you questions about your:  Alcohol, tobacco, and drug use.  Emotional well-being.  Home and relationship well-being.  Sexual activity.  Eating habits.  Work and work Statistician.  Method of birth control.  Menstrual cycle.  Pregnancy history. What immunizations do I need?  Influenza (flu) vaccine  This is recommended every year. Tetanus, diphtheria, and pertussis (Tdap) vaccine  You may need a Td booster every 10 years. Varicella (chickenpox) vaccine  You may need this if you have not been vaccinated. Zoster (shingles) vaccine  You may need this after age 51. Measles, mumps, and  rubella (MMR) vaccine  You may need at least one dose of MMR if you were born in 1957 or later. You may also need a second dose. Pneumococcal conjugate (PCV13) vaccine  You may need this if you have certain conditions and were not previously vaccinated. Pneumococcal polysaccharide (PPSV23) vaccine  You may need one or two doses if you smoke cigarettes or if you have certain conditions. Meningococcal conjugate (MenACWY) vaccine  You may need this if you have certain conditions. Hepatitis A vaccine  You may need this if you have certain conditions or if you travel or work in places where you may be exposed to hepatitis A. Hepatitis B vaccine  You may need this if you have certain conditions or if you travel or work in places where you may be exposed to hepatitis B. Haemophilus influenzae type b (Hib) vaccine  You may need this if you have certain conditions. Human papillomavirus (HPV) vaccine  If recommended by your health care provider, you may need three doses over 6 months. You may receive vaccines as individual doses or as more than one vaccine together in one shot (combination vaccines). Talk with your health care provider about the risks and benefits of combination vaccines. What tests do I need? Blood tests  Lipid and cholesterol levels. These may be checked every 5 years, or more frequently if you are over 86 years old.  Hepatitis C test.  Hepatitis B test. Screening  Lung cancer screening. You may have this screening every year starting at age 42 if you have a 30-pack-year history of smoking and currently smoke or  have quit within the past 15 years.  Colorectal cancer screening. All adults should have this screening starting at age 35 and continuing until age 66. Your health care provider may recommend screening at age 53 if you are at increased risk. You will have tests every 1-10 years, depending on your results and the type of screening test.  Diabetes screening. This  is done by checking your blood sugar (glucose) after you have not eaten for a while (fasting). You may have this done every 1-3 years.  Mammogram. This may be done every 1-2 years. Talk with your health care provider about when you should start having regular mammograms. This may depend on whether you have a family history of breast cancer.  BRCA-related cancer screening. This may be done if you have a family history of breast, ovarian, tubal, or peritoneal cancers.  Pelvic exam and Pap test. This may be done every 3 years starting at age 51. Starting at age 71, this may be done every 5 years if you have a Pap test in combination with an HPV test. Other tests  Sexually transmitted disease (STD) testing.  Bone density scan. This is done to screen for osteoporosis. You may have this scan if you are at high risk for osteoporosis. Follow these instructions at home: Eating and drinking  Eat a diet that includes fresh fruits and vegetables, whole grains, lean protein, and low-fat dairy.  Take vitamin and mineral supplements as recommended by your health care provider.  Do not drink alcohol if: ? Your health care provider tells you not to drink. ? You are pregnant, may be pregnant, or are planning to become pregnant.  If you drink alcohol: ? Limit how much you have to 0-1 drink a day. ? Be aware of how much alcohol is in your drink. In the [place] one drink equals one 12 oz bottle of beer (355 mL), one 5 oz glass of wine (148 mL), or one 1 oz glass of hard liquor (44 mL). Lifestyle  Take daily care of your teeth and gums.  Stay active. Exercise for at least 30 minutes on 5 or more days each week.  Do not use any products that contain nicotine or tobacco, such as cigarettes, e-cigarettes, and chewing tobacco. If you need help quitting, ask your health care provider.  If you are sexually active, practice safe sex. Use a condom or other form of birth control (contraception) in order to prevent  pregnancy and STIs (sexually transmitted infections).  If told by your health care provider, take low-dose aspirin daily starting at age 61. What's next?  Visit your health care provider once a year for a well check visit.  Ask your health care provider how often you should have your eyes and teeth checked.  Stay up to date on all vaccines. This information is not intended to replace advice given to you by your health care provider. Make sure you discuss any questions you have with your health care provider. Document Revised: 12/30/2017 Document Reviewed: 12/30/2017 Elsevier Patient Education  2020 [employer].

## 2019-07-01 ENCOUNTER — Other Ambulatory Visit: Payer: Self-pay | Admitting: Family Medicine

## 2019-07-02 ENCOUNTER — Other Ambulatory Visit: Payer: Self-pay | Admitting: Family Medicine

## 2019-07-07 ENCOUNTER — Encounter: Payer: Self-pay | Admitting: Family Medicine

## 2019-12-05 ENCOUNTER — Telehealth: Payer: Self-pay | Admitting: *Deleted

## 2019-12-05 NOTE — Telephone Encounter (Signed)
A detailed message was left, re: her follow up visit. 

## 2020-01-01 ENCOUNTER — Telehealth: Payer: Self-pay | Admitting: Cardiology

## 2020-01-01 NOTE — Telephone Encounter (Signed)
lvm for patient to return call to get follow up scheduled with Christopher from recall list 

## 2020-01-26 ENCOUNTER — Encounter: Payer: Self-pay | Admitting: Cardiology

## 2020-03-31 ENCOUNTER — Encounter (INDEPENDENT_AMBULATORY_CARE_PROVIDER_SITE_OTHER): Payer: Self-pay

## 2020-06-07 ENCOUNTER — Encounter: Payer: BC Managed Care – PPO | Admitting: Family Medicine

## 2020-06-27 LAB — HM MAMMOGRAPHY

## 2020-07-02 ENCOUNTER — Other Ambulatory Visit: Payer: Self-pay | Admitting: Family Medicine

## 2020-07-12 ENCOUNTER — Encounter: Payer: Self-pay | Admitting: Family Medicine

## 2020-07-24 ENCOUNTER — Other Ambulatory Visit: Payer: Self-pay

## 2020-07-24 ENCOUNTER — Encounter: Payer: Self-pay | Admitting: Family Medicine

## 2020-07-24 ENCOUNTER — Telehealth (INDEPENDENT_AMBULATORY_CARE_PROVIDER_SITE_OTHER): Payer: BC Managed Care – PPO | Admitting: Family Medicine

## 2020-07-24 DIAGNOSIS — J9801 Acute bronchospasm: Secondary | ICD-10-CM | POA: Diagnosis not present

## 2020-07-24 DIAGNOSIS — U071 COVID-19: Secondary | ICD-10-CM

## 2020-07-24 MED ORDER — ALBUTEROL SULFATE HFA 108 (90 BASE) MCG/ACT IN AERS: 2.0000 | INHALATION_SPRAY | RESPIRATORY_TRACT | 2 refills | Status: DC | PRN

## 2020-07-24 NOTE — Progress Notes (Signed)
Virtual Visit via Video Note  Subjective  CC:  Chief Complaint  Patient presents with  . Covid Positive    Rapid test Monday morning and PCR Monday afternoon, symptoms started 3/17 - sore throat and productive cough.      I connected with Judithann Graves on 07/24/20 at  4:30 PM EDT by a video enabled telemedicine application and verified that I am speaking with the correct person using two identifiers. Location patient: Home Location provider: [facility] [address] Office Persons participating in the virtual visit: Trey Bebee, Willow Ora, MD Adela Glimpse CMA  I discussed the limitations of evaluation and management by telemedicine and the availability of in person appointments. The patient expressed understanding and agreed to proceed. HPI: [name] [name] is a 60 y.o. female who was contacted today to address the problems listed above in the chief complaint. . 60 year old fully vaccinated female with no high risk medical conditions has tested positive for Covid.  She is on day 6 of symptoms.  Has cough with tightness in the chest and some wheezing, headache, loss of taste and smell and fatigue.  No fevers or chills.  No dyspnea on exertion.  No nausea, vomiting or diarrhea.  Appetite is very low.  She is drinking well.  Normal urination.  No body aches.  She is taking Mucinex and Advil.   Assessment  1. COVID-19 virus infection      Plan   Covid virus infection: Patient is stable.  Recommend albuterol for wheezing and tightness in the chest.  Continue Mucinex or Mucinex DM for cough.  Advil for headache.  Keep her hydrated and monitor symptoms.  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  She is not high risk and is unlikely to benefit from the antibody monoclonal infusion.  Follow-up if worsening. I discussed the assessment and treatment plan with the patient. The patient was provided an opportunity to ask  questions and all were answered. The patient agreed with the plan and demonstrated an understanding of the instructions.   The patient was advised to call back or seek an in-person evaluation if the symptoms worsen or if the condition fails to improve as anticipated. Follow up: As scheduled 10/10/2020  Meds ordered this encounter  Medications  . albuterol (VENTOLIN HFA) 108 (90 Base) MCG/ACT inhaler    Sig: Inhale 2 puffs into the lungs every 4 (four) hours as needed for wheezing or shortness of breath.    Dispense:  1 each    Refill:  2      I reviewed the patients updated PMH, FH, and SocHx.    Patient Active Problem List   Diagnosis Date Noted  . Tardive dyskinesia 06/03/2018    Priority: High  . 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 Obesity (BMI 30-39.9) 07/27/2012    Priority: High  . Family history of premature CAD 07/26/2012    Priority: High  . Chronic recurrent major depressive disorder (HCC) 09/09/2011    Priority: High  . Postherpetic neuralgia 06/06/2019    Priority: Medium  . Retinal detachment 12/31/2011    Priority: Medium  . History of atrial flutter 09/29/2011    Priority: Medium  . Atrophic vaginitis 06/03/2018    Priority: Low  . AR (allergic rhinitis) 08/01/2010    Priority: Low   Current Meds  Medication Sig  . albuterol (VENTOLIN HFA) 108 (90  Base) MCG/ACT inhaler Inhale 2 puffs into the lungs every 4 (four) hours as needed for wheezing or shortness of breath.  . Ascorbic Acid (VITAMIN C) 1000 MG tablet Take 1,000 mg by mouth daily.  Marland Kitchen aspirin 81 MG EC tablet Take 81 mg by mouth daily.  Marland Kitchen b complex vitamins tablet Take 1 tablet by mouth daily.  . cholecalciferol (VITAMIN D) 1000 units tablet Take 1,000 Units by mouth daily.  . diazepam (VALIUM) 2 MG tablet daily as needed.   . ezetimibe (ZETIA) 10 MG tablet TAKE 1 TABLET(10 MG) BY MOUTH AT BEDTIME  . magnesium 30 MG tablet Take 30 mg by mouth daily.  . Multiple Vitamin  (MULTIVITAMIN) tablet Take 1 tablet by mouth daily.  . Omega-3 Fatty Acids (FISH OIL) 1000 MG CAPS Take by mouth.  . rosuvastatin (CRESTOR) 5 MG tablet TAKE 1 TABLET(5 MG) BY MOUTH AT BEDTIME  . venlafaxine (EFFEXOR) 37.5 MG tablet Take 37.5 mg by mouth daily.  Marland Kitchen venlafaxine XR (EFFEXOR-XR) 75 MG 24 hr capsule 1 capsule daily.    Allergies: Patient is allergic to wellbutrin [bupropion], aripiprazole, and statins. Family History: Patient family history includes Coronary artery disease in her brother and father; Hypertension in her mother; Stroke in her mother. Social History:  Patient  reports that she quit smoking about 19 years ago. She has never used smokeless tobacco. She reports current alcohol use of about 2.0 standard drinks of alcohol per week. She reports that she does not use drugs.  Review of Systems: Constitutional: Negative for fever malaise or anorexia Cardiovascular: negative for chest pain Respiratory: negative for SOB or persistent cough Gastrointestinal: negative for abdominal pain  OBJECTIVE Vitals: There were no vitals taken for this visit. General: no acute distress , A&Ox3  Willow Ora, MD

## 2020-07-27 ENCOUNTER — Other Ambulatory Visit: Payer: Self-pay

## 2020-07-27 ENCOUNTER — Emergency Department (HOSPITAL_COMMUNITY): Payer: BC Managed Care – PPO

## 2020-07-27 ENCOUNTER — Emergency Department (HOSPITAL_COMMUNITY)
Admission: EM | Admit: 2020-07-27 | Discharge: 2020-07-27 | Disposition: A | Discharge status: Home / Self Care | Payer: BC Managed Care – PPO | Attending: Emergency Medicine | Admitting: Emergency Medicine

## 2020-07-27 ENCOUNTER — Encounter (HOSPITAL_COMMUNITY): Payer: Self-pay

## 2020-07-27 DIAGNOSIS — U071 COVID-19: Secondary | ICD-10-CM | POA: Diagnosis not present

## 2020-07-27 DIAGNOSIS — R0602 Shortness of breath: Secondary | ICD-10-CM

## 2020-07-27 DIAGNOSIS — Z87891 Personal history of nicotine dependence: Secondary | ICD-10-CM | POA: Diagnosis not present

## 2020-07-27 DIAGNOSIS — Z7982 Long term (current) use of aspirin: Secondary | ICD-10-CM | POA: Diagnosis not present

## 2020-07-27 NOTE — ED Provider Notes (Signed)
[facility]-EMERGENCY DEPT Provider Note   CSN: [id number] Arrival date & time: 07/27/20  1033     History Chief Complaint  Patient presents with  . Shortness of Breath    [name] [name] is a 60 y.o. female with PMH/o Atrial flutter, HLD who presents for evaluation for SOB.  She reports that 9 days ago, she started having nasal congestion, rhinorrhea, body aches.  She got tested for Covid and was positive.  She reports that she has been vaccinated x3.  She reports that she is still been having symptoms though they have improved.  She states she has been coughing, having a headache.  She also reports that she feels like her breathing is getting more labored.  She had a telehealth visit today and was recommended to go to the emergency department for further evaluation.  No history of asthma, COPD.  She does not smoke.  She reports that sometimes when she coughs or gets well short of breath, she feels irritation/pain in her chest. Currently denies any CP. She denies any OCP use, recent immobilization, prior history of DVT/PE, recent surgery, leg swelling, or long travel.   The history is provided by the patient.       Past Medical History:  Diagnosis Date  . Atrial flutter with rapid ventricular response (HCC) 09/08/2011  . Detached retina   . High cholesterol   . Hyperlipemia 09/08/2011    Patient Active Problem List   Diagnosis Date Noted  . Postherpetic neuralgia 06/06/2019  . Atrophic vaginitis 06/03/2018  . Tardive dyskinesia 06/03/2018  . 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 Obesity (BMI 30-39.9) 07/27/2012  . Family history of premature CAD 07/26/2012  . Retinal detachment 12/31/2011  . History of atrial flutter 09/29/2011  . Chronic recurrent major depressive disorder (HCC) 09/09/2011  . AR (allergic rhinitis) 08/01/2010    Past Surgical History:  Procedure Laterality Date  . APPENDECTOMY    . ATRIAL FLUTTER ABLATION N/A 12/15/2011   Procedure:  ATRIAL FLUTTER ABLATION;  Surgeon: Marinus Maw, MD;  Location: [facility] CATH LAB;  Service: Cardiovascular;  Laterality: N/A;  . CARDIOVERSION  12/04/2011   Procedure: CARDIOVERSION;  Surgeon: Thurmon Fair, MD;  Location: [facility] ENDOSCOPY;  Service: Cardiovascular;  Laterality: N/A;  . TEE WITHOUT CARDIOVERSION  09/09/2011   Procedure: TRANSESOPHAGEAL ECHOCARDIOGRAM (TEE);  Surgeon: Thurmon Fair, MD;  Location: [facility] ENDOSCOPY;  Service: Cardiovascular;  Laterality: N/A;  anes. not able to come fo cardioversion / pt TBA but in ER  . TEE WITHOUT CARDIOVERSION  12/04/2011   Procedure: TRANSESOPHAGEAL ECHOCARDIOGRAM (TEE);  Surgeon: Thurmon Fair, MD;  Location: [facility] ENDOSCOPY;  Service: Cardiovascular;  Laterality: N/A;  . TONSILLECTOMY    . TUBAL LIGATION       OB History   No obstetric history on file.     Family History  Problem Relation Age of Onset  . Stroke Mother   . Hypertension Mother   . Coronary artery disease Father   . Coronary artery disease Brother     Social History   Tobacco Use  . Smoking status: Former Smoker    Quit date: 05/04/2001    Years since quitting: 19.2  . Smokeless tobacco: Never Used  Vaping Use  . Vaping Use: Never used  Substance Use Topics  . Alcohol use: Yes    Alcohol/week: 2.0 standard drinks    Types: 2 Cans of beer per week  . Drug use: No    Home Medications Prior to Admission medications  Medication Sig Start Date End Date Taking? Authorizing Provider  albuterol (VENTOLIN HFA) 108 (90 Base) MCG/ACT inhaler Inhale 2 puffs into the lungs every 4 (four) hours as needed for wheezing or shortness of breath. 07/24/20   Willow OraAndy, Camille L, MD  Ascorbic Acid (VITAMIN C) 1000 MG tablet Take 1,000 mg by mouth daily.    [provider]  aspirin 81 MG EC tablet Take 81 mg by mouth daily.    [provider]  b complex vitamins tablet Take 1 tablet by mouth daily.    [provider]  cholecalciferol (VITAMIN D) 1000 units tablet Take  1,000 Units by mouth daily.    [provider]  diazepam (VALIUM) 2 MG tablet daily as needed.  07/01/18   [provider]  ezetimibe (ZETIA) 10 MG tablet TAKE 1 TABLET(10 MG) BY MOUTH AT BEDTIME 07/02/20   Willow OraAndy, Camille L, MD  magnesium 30 MG tablet Take 30 mg by mouth daily.    [provider]  Multiple Vitamin (MULTIVITAMIN) tablet Take 1 tablet by mouth daily.    [provider]  Omega-3 Fatty Acids (FISH OIL) 1000 MG CAPS Take by mouth.    [provider]  rosuvastatin (CRESTOR) 5 MG tablet TAKE 1 TABLET(5 MG) BY MOUTH AT BEDTIME 06/06/19   Willow OraAndy, Camille L, MD  venlafaxine [facility](EFFEXOR) 37.5 MG tablet Take 37.5 mg by mouth daily.    [provider]  venlafaxine XR (EFFEXOR-XR) 75 MG 24 hr capsule 1 capsule daily. 03/18/18   [provider]    Allergies    Wellbutrin [bupropion], Aripiprazole, and Statins  Review of Systems   Review of Systems  Constitutional: Negative for fever.  Respiratory: Positive for cough and shortness of breath.   Cardiovascular: Negative for chest pain.  Gastrointestinal: Negative for abdominal pain, nausea and vomiting.  Genitourinary: Negative for dysuria and hematuria.  Musculoskeletal: Positive for myalgias.  Neurological: Negative for headaches.  All other systems reviewed and are negative.   Physical Exam Updated Vital Signs BP 124/67   Pulse 72   Temp 98.2 F (36.8 C) (Oral)   Resp 18   SpO2 96%   Physical Exam Vitals and nursing note reviewed.  Constitutional:      Appearance: Normal appearance. She is well-developed.     Comments: Well appearing.   HENT:     Head: Normocephalic and atraumatic.  Eyes:     General: Lids are normal.     Conjunctiva/sclera: Conjunctivae normal.     Pupils: Pupils are equal, round, and reactive to light.  Cardiovascular:     Rate and Rhythm: Normal rate and regular rhythm.     Pulses: Normal pulses.          Radial pulses are 2+ on the right side  and 2+ on the left side.       Dorsalis pedis pulses are 2+ on the right side and 2+ on the left side.     Heart sounds: Normal heart sounds. No murmur heard. No friction rub. No gallop.   Pulmonary:     Effort: Pulmonary effort is normal.     Breath sounds: Normal breath sounds.     Comments: Lungs clear to auscultation bilaterally.  Symmetric chest rise.  No wheezing, rales, rhonchi. Speaking in full sentences without any difficulty.  Abdominal:     Palpations: Abdomen is soft. Abdomen is not rigid.     Tenderness: There is no abdominal tenderness. There is no guarding.  Musculoskeletal:  General: Normal range of motion.     Cervical back: Full passive range of motion without pain.     Comments: BLE are symmetric in appearance without any overlying warmth, erythema, edema.   Skin:    General: Skin is warm and dry.     Capillary Refill: Capillary refill takes less than 2 seconds.  Neurological:     Mental Status: She is alert and oriented to person, place, and time.  Psychiatric:        Speech: Speech normal.     ED Results / Procedures / Treatments   Labs (all labs ordered are listed, but only abnormal results are displayed) Labs Reviewed - No data to display  EKG None  Radiology DG Chest [facility] 1 View  Result Date: 07/27/2020 CLINICAL DATA:  Shortness of breath worsening since Wednesday, COVID-19 positive 8 days ago EXAM: PORTABLE CHEST 1 VIEW COMPARISON:  Portable exam 1201 hours compared to 09/07/2011 FINDINGS: Upper-normal size of cardiac silhouette. Mediastinal contours and pulmonary vascularity normal. Atherosclerotic calcification aorta. Lungs clear. No pulmonary infiltrate, pleural effusion, or pneumothorax. Osseous structures unremarkable. IMPRESSION: No acute abnormalities. Electronically Signed   By: Ulyses Southward M.D.   On: 07/27/2020 12:16    Procedures Procedures   Medications Ordered in ED Medications - No data to display  ED Course  I have reviewed  the triage vital signs and the nursing notes.  Pertinent labs & imaging results that were available during my care of the patient were reviewed by me and considered in my medical decision making (see chart for details).    MDM Rules/Calculators/A&P                          60 year old female who presents for evaluation of known COVID-19, shortness of breath, cough, headache, fatigue.  She reports that she initially started having symptoms about 9 days ago.  She was tested for Covid about 5 days ago and was found to be positive.  She reports that she has had cough, congestion, runny nose, headache.  She reports her last couple days, she has had some shortness of breath.  She had a virtual visit and was told to go to the emergency department for further evaluation.  On initial arrival, she is afebrile, nontoxic-appearing.  Vital signs are stable.  She has no evidence of hypoxia or tachycardia.  On exam, she is speaking full sentences without any difficulty.  No evidence of respiratory distress.  Lungs clear to auscultation with no obvious signs of wheezing.  Suspect her symptoms are related to ongoing COVID-19 infection.  History/physical exam concerning for ACS etiology.  Do not suspect PE.  Though she has COVID-19, she does not have any other PE risk factors.  Additionally, she is not hypoxic or tachycardic here in the ED.  Will obtain chest x-ray and evaluate.  Chest x-ray reviewed.  Negative for any infectious etiology.  Patient ambulated in the ED while maintaining O2 sats of 96-98%.  I discussed findings with patient.  At this time, she is hemodynamically stable and is well-appearing.  No clinical criteria for admission at this time.  At this time, I feel the patient can be managed outpatient.  She did discuss with me that she had had a couple episodes of chest pain when she got short of breath here in the ED but states that it resolved.  I did offer both her and her wife further evaluation here in  the ED  with EKG, blood work.  Both patient and wife declined further work-up here in the ED.  Patient referred to [facility] treatment center.  She was prescribed an albuterol inhaler.  I discussed with her regarding using the albuterol inhaler.  Discussed with patient that she should closely monitor symptoms and if it anytime, she feels like it is getting worse return.  I again offered further work-up regarding the intermittent chest pain that she was having but patient declined.  Wife and patient are agreeable to plan. At this time, patient exhibits no emergent life-threatening condition that require further evaluation in ED. Patient had ample opportunity for questions and discussion. All patient's questions were answered with full understanding. Strict return precautions discussed. Patient expresses understanding and agreement to plan.   Portions of this note were generated with Scientist, clinical (histocompatibility and immunogenetics). Dictation errors may occur despite best attempts at proofreading.  Final Clinical Impression(s) / ED Diagnoses Final diagnoses:  COVID-19  Shortness of breath    Rx / DC Orders ED Discharge Orders         Ordered    Ambulatory referral for Covid Treatment        07/27/20 1322           Rosana Hoes 07/27/20 1546    Pollyann Savoy, MD 07/28/20 [phone]

## 2020-07-27 NOTE — ED Triage Notes (Signed)
Pt arrived via walk in, COVID positive 8 days ago. Sx lessening, but states she had a tele visit this morning with PCP who said she wanted her breathing to get evaluated at ED. Pt endorses some SOB. Breathing appears even and unlabored at this time.    Pt ambulated through lobby, spo2 98% immediately after.

## 2020-07-27 NOTE — ED Notes (Signed)
Patient ambulated in room with pulse ox without assistance, oxygen 96-99% throughout ambulation, talked in full sentences throughout ambulation, no SOB noted.

## 2020-07-27 NOTE — Discharge Instructions (Signed)
Use your albuterol inhaler as directed.  As we discussed, your chest x-ray was reassuring here in the emergency department.  Closely monitor your symptoms.  If it anytime, you feel like the shortness of breath is getting worse, you have persistent, worsening chest pain, vomiting or any other worsening concerning symptoms, return to emergency department immediately.  I have given you a referral to the Covid center.  They will contact you for eligible for any further treatment.

## 2020-08-06 ENCOUNTER — Ambulatory Visit (INDEPENDENT_AMBULATORY_CARE_PROVIDER_SITE_OTHER): Payer: BC Managed Care – PPO | Admitting: Nurse Practitioner

## 2020-08-06 VITALS — BP 112/55 | HR 80 | Temp 97.8°F | Resp 16 | Ht 68.0 in | Wt 198.0 lb

## 2020-08-06 DIAGNOSIS — U071 COVID-19: Secondary | ICD-10-CM

## 2020-08-06 MED ORDER — PREDNISONE 10 MG PO TABS: ORAL_TABLET | ORAL | 0 refills | Status: DC

## 2020-08-06 NOTE — Patient Instructions (Signed)
Covid 19 Shortness of breath Fatigue Decreased appetite:   Stay well hydrated  Stay active  Deep breathing exercises  May take tylenol for fever or pain  Will order prednisone  Eat six small meals per day    Follow up:  Follow up in 2 weeks or sooner if needed

## 2020-08-06 NOTE — Progress Notes (Signed)
@Patient  ID: , female    DOB: August 02, 1960, 60 y.o.   MRN: [id number]  Chief Complaint  Patient presents with  . Follow-up    ED on 3/26 states she tested positive on 3/21 and again 3/27. Feels better other than sluggish and continues to have SOB at times.     Referring provider: 4/27, MD   HPI  Patient presents today for post COVID care clinic visit.  Patient tested positive for Covid in March.  Recent chest x-ray was clear.  Patient states that overall she is improving but continues to have fatigue and shortness of breath with exertion at times.  Patient was walked in office today O2 sats remained stable and heart rate was stable.  Patient does complain of decreased appetite.  We discussed that it may be easier for her to do 6 small meals/snacks per day.  It is important to keep her blood sugar stable.  Stay well-hydrated is also important. Denies f/c/s, n/v/d, hemoptysis, PND, chest pain or edema.      Allergies  Allergen Reactions  . Wellbutrin [Bupropion] Anaphylaxis  . Aripiprazole Swelling  . Statins Other (See Comments)    Myalgias with Crestor and Lipitor 8 yrs ago; MUSCLE ACHES    Immunization History  Administered Date(s) Administered  . Influenza,inj,Quad PF,6+ Mos 04/30/2017, 03/04/2018, 01/30/2019  . PFIZER(Purple Top)SARS-COV-2 Vaccination 06/30/2019, 07/21/2019, 04/21/2020  . Tdap 10/07/2010, 08/05/2013    Past Medical History:  Diagnosis Date  . Atrial flutter with rapid ventricular response (HCC) 09/08/2011  . Detached retina   . High cholesterol   . Hyperlipemia 09/08/2011    Tobacco History: Social History   Tobacco Use  Smoking Status Former Smoker  . Quit date: 05/04/2001  . Years since quitting: 19.2  Smokeless Tobacco Never Used   Counseling given: Not Answered   Outpatient Encounter Medications as of 08/06/2020  Medication Sig  . albuterol (VENTOLIN HFA) 108 (90 Base) MCG/ACT inhaler Inhale 2 puffs into the lungs every 4  (four) hours as needed for wheezing or shortness of breath.  . Ascorbic Acid (VITAMIN C) 1000 MG tablet Take 1,000 mg by mouth daily.  10/06/2020 aspirin 81 MG EC tablet Take 81 mg by mouth daily.  Marland Kitchen b complex vitamins tablet Take 1 tablet by mouth daily.  . cholecalciferol (VITAMIN D) 1000 units tablet Take 1,000 Units by mouth daily.  . diazepam (VALIUM) 2 MG tablet daily as needed.   . ezetimibe (ZETIA) 10 MG tablet TAKE 1 TABLET(10 MG) BY MOUTH AT BEDTIME  . Multiple Vitamin (MULTIVITAMIN) tablet Take 1 tablet by mouth daily.  . Omega-3 Fatty Acids (FISH OIL) 1000 MG CAPS Take by mouth.  . predniSONE (DELTASONE) 10 MG tablet Take 4 tabs for 2 days, then 3 tabs for 2 days, then 2 tabs for 2 days, then 1 tab for 2 days, then stop  . rosuvastatin (CRESTOR) 5 MG tablet TAKE 1 TABLET(5 MG) BY MOUTH AT BEDTIME  . venlafaxine (EFFEXOR) 37.5 MG tablet Take 37.5 mg by mouth daily.  Marland Kitchen venlafaxine XR (EFFEXOR-XR) 75 MG 24 hr capsule 1 capsule daily.  . [DISCONTINUED] magnesium 30 MG tablet Take 30 mg by mouth daily.   No facility-administered encounter medications on file as of 08/06/2020.     Review of Systems  Review of Systems  Constitutional: Positive for fatigue. Negative for fever.  HENT: Negative.   Respiratory: Positive for shortness of breath. Negative for cough.   Cardiovascular: Negative.  Negative for chest pain, palpitations  and leg swelling.  Gastrointestinal: Negative.   Allergic/Immunologic: Negative.   Neurological: Negative.   Psychiatric/Behavioral: Negative.        Physical Exam  BP (!) 112/55   Pulse 80   Temp 97.8 F (36.6 C) (Temporal)   Resp 16   Ht 5\' 8"  (1.727 m)   Wt 198 lb (89.8 kg)   SpO2 96%   BMI 30.11 kg/m   Wt Readings from Last 5 Encounters:  08/06/20 198 lb (89.8 kg)  06/06/19 205 lb 12.8 oz (93.4 kg)  01/30/19 203 lb 9.6 oz (92.4 kg)  01/16/19 201 lb (91.2 kg)  09/22/18 197 lb 12.8 oz (89.7 kg)     Physical Exam Vitals and nursing note  reviewed.  Constitutional:      General: She is not in acute distress.    Appearance: She is well-developed.  Cardiovascular:     Rate and Rhythm: Normal rate and regular rhythm.  Pulmonary:     Effort: Pulmonary effort is normal.     Breath sounds: Normal breath sounds.  Musculoskeletal:     Right lower leg: No edema.     Left lower leg: No edema.  Neurological:     Mental Status: She is alert and oriented to person, place, and time.  Psychiatric:        Mood and Affect: Mood normal.        Behavior: Behavior normal.      Imaging: DG Chest Port 1 View  Result Date: 07/27/2020 CLINICAL DATA:  Shortness of breath worsening since Wednesday, COVID-19 positive 8 days ago EXAM: PORTABLE CHEST 1 VIEW COMPARISON:  Portable exam 1201 hours compared to 09/07/2011 FINDINGS: Upper-normal size of cardiac silhouette. Mediastinal contours and pulmonary vascularity normal. Atherosclerotic calcification aorta. Lungs clear. No pulmonary infiltrate, pleural effusion, or pneumothorax. Osseous structures unremarkable. IMPRESSION: No acute abnormalities. Electronically Signed   By: 11/07/2011 M.D.   On: 07/27/2020 12:16     Assessment & Plan:   COVID-19 Shortness of breath Fatigue Decreased appetite:   Stay well hydrated  Stay active  Deep breathing exercises  May take tylenol for fever or pain  Will order prednisone  Eat six small meals per day    Follow up:  Follow up in 2 weeks or sooner if needed      07/29/2020, NP 08/08/2020

## 2020-08-08 ENCOUNTER — Encounter (INDEPENDENT_AMBULATORY_CARE_PROVIDER_SITE_OTHER): Payer: Self-pay

## 2020-08-08 DIAGNOSIS — U071 COVID-19: Secondary | ICD-10-CM | POA: Insufficient documentation

## 2020-08-08 NOTE — Assessment & Plan Note (Signed)
Shortness of breath Fatigue Decreased appetite:   Stay well hydrated  Stay active  Deep breathing exercises  May take tylenol for fever or pain  Will order prednisone  Eat six small meals per day    Follow up:  Follow up in 2 weeks or sooner if needed

## 2020-08-27 ENCOUNTER — Other Ambulatory Visit: Payer: Self-pay

## 2020-08-27 ENCOUNTER — Encounter: Payer: Self-pay | Admitting: Nurse Practitioner

## 2020-08-27 ENCOUNTER — Ambulatory Visit: Payer: BC Managed Care – PPO | Admitting: Nurse Practitioner

## 2020-08-27 VITALS — BP 117/72 | HR 89 | Resp 16 | Wt 198.0 lb

## 2020-08-27 DIAGNOSIS — R439 Unspecified disturbances of smell and taste: Secondary | ICD-10-CM | POA: Diagnosis not present

## 2020-08-27 DIAGNOSIS — G8929 Other chronic pain: Secondary | ICD-10-CM | POA: Insufficient documentation

## 2020-08-27 DIAGNOSIS — R519 Headache, unspecified: Secondary | ICD-10-CM | POA: Diagnosis not present

## 2020-08-27 DIAGNOSIS — Z8616 Personal history of COVID-19: Secondary | ICD-10-CM | POA: Insufficient documentation

## 2020-08-27 NOTE — Patient Instructions (Addendum)
Covid 19 Shortness of breath Fatigue Decreased appetite Loss of taste and smell:   Stay well hydrated  Stay active  Deep breathing exercises  May take tylenol for fever or pain  Eat six small meals per day  Continue essential oils for olfactory re-training   Headaches:  May start magnesium 600 mg daily  May start Riboflavin 400 mg daily  Will place referral to neurology - Dr. Vickey Huger    Follow up:  Follow up in 3 months or sooner if needed

## 2020-08-27 NOTE — Progress Notes (Signed)
@Patient  ID: , female    DOB: 06/27/1960, 60 y.o.   MRN: [id number]  Chief Complaint  Patient presents with  . covid follow up    Referring provider: [id number], MD  HPI   Patient presents today for post-COVID care clinic visit.  Patient states that her fatigue has improved since her last visit here on 08/06/2020.  She has been eating better.  Unfortunately her taste and smell has not returned as of yet.  She has been trying essential oils.  We did suggest trying Flonase as well.  Patient also complains today of chronic headaches since diagnosed with COVID.  She does have a history of headaches in the past.  She states that she feels like her headaches are worsening so she has had COVID. Denies f/c/s, n/v/d, hemoptysis, PND, chest pain or edema.        Allergies  Allergen Reactions  . Wellbutrin [Bupropion] Anaphylaxis  . Aripiprazole Swelling  . Statins Other (See Comments)    Myalgias with Crestor and Lipitor 8 yrs ago; MUSCLE ACHES    Immunization History  Administered Date(s) Administered  . Influenza,inj,Quad PF,6+ Mos 04/30/2017, 03/04/2018, 01/30/2019  . PFIZER(Purple Top)SARS-COV-2 Vaccination 06/30/2019, 07/21/2019, 04/21/2020  . Tdap 10/07/2010, 08/05/2013    Past Medical History:  Diagnosis Date  . Atrial flutter with rapid ventricular response (HCC) 09/08/2011  . Detached retina   . High cholesterol   . Hyperlipemia 09/08/2011    Tobacco History: Social History   Tobacco Use  Smoking Status Former Smoker  . Quit date: 05/04/2001  . Years since quitting: 19.3  Smokeless Tobacco Never Used   Counseling given: Not Answered   Outpatient Encounter Medications as of 08/27/2020  Medication Sig  . Ascorbic Acid (VITAMIN C) 1000 MG tablet Take 1,000 mg by mouth daily.  08/29/2020 aspirin 81 MG EC tablet Take 81 mg by mouth daily.  Marland Kitchen b complex vitamins tablet Take 1 tablet by mouth daily.  . cholecalciferol (VITAMIN D) 1000 units tablet Take 1,000 Units by  mouth daily.  . diazepam (VALIUM) 2 MG tablet daily as needed.   . ezetimibe (ZETIA) 10 MG tablet TAKE 1 TABLET(10 MG) BY MOUTH AT BEDTIME  . Multiple Vitamin (MULTIVITAMIN) tablet Take 1 tablet by mouth daily.  . Omega-3 Fatty Acids (FISH OIL) 1000 MG CAPS Take by mouth.  . rosuvastatin (CRESTOR) 5 MG tablet TAKE 1 TABLET(5 MG) BY MOUTH AT BEDTIME  . venlafaxine (EFFEXOR) 37.5 MG tablet Take 37.5 mg by mouth daily.  Marland Kitchen venlafaxine XR (EFFEXOR-XR) 75 MG 24 hr capsule 1 capsule daily.  . [DISCONTINUED] albuterol (VENTOLIN HFA) 108 (90 Base) MCG/ACT inhaler Inhale 2 puffs into the lungs every 4 (four) hours as needed for wheezing or shortness of breath. (Patient not taking: Reported on 08/27/2020)  . [DISCONTINUED] predniSONE (DELTASONE) 10 MG tablet Take 4 tabs for 2 days, then 3 tabs for 2 days, then 2 tabs for 2 days, then 1 tab for 2 days, then stop (Patient not taking: Reported on 08/27/2020)   No facility-administered encounter medications on file as of 08/27/2020.     Review of Systems  Review of Systems  Constitutional: Negative.   HENT: Negative.   Respiratory: Negative for cough and shortness of breath.   Cardiovascular: Negative.   Gastrointestinal: Negative.   Allergic/Immunologic: Negative.   Neurological: Positive for headaches.       Altered taste and smell  Psychiatric/Behavioral: Negative.        Physical Exam  BP 117/72  Pulse 89   Resp 16   Wt 198 lb (89.8 kg)   SpO2 94%   BMI 30.11 kg/m   Wt Readings from Last 5 Encounters:  08/27/20 198 lb (89.8 kg)  08/06/20 198 lb (89.8 kg)  06/06/19 205 lb 12.8 oz (93.4 kg)  01/30/19 203 lb 9.6 oz (92.4 kg)  01/16/19 201 lb (91.2 kg)     Physical Exam Vitals and nursing note reviewed.  Constitutional:      General: She is not in acute distress.    Appearance: She is well-developed.  Cardiovascular:     Rate and Rhythm: Normal rate and regular rhythm.  Pulmonary:     Effort: Pulmonary effort is normal.      Breath sounds: Normal breath sounds.  Musculoskeletal:     Right lower leg: No edema.     Left lower leg: No edema.  Neurological:     Mental Status: She is alert and oriented to person, place, and time.  Psychiatric:        Mood and Affect: Mood normal.        Behavior: Behavior normal.        Assessment & Plan:   History of COVID-19 Shortness of breath Fatigue Decreased appetite Loss of taste and smell:   Stay well hydrated  Stay active  Deep breathing exercises  May take tylenol for fever or pain  Eat six small meals per day  Continue essential oils for olfactory re-training   Headaches:  May start magnesium 600 mg daily  May start Riboflavin 400 mg daily  Will place referral to neurology - Dr. Vickey Huger    Follow up:  Follow up in 3 months or sooner if needed      Ivonne Andrew, NP 08/27/2020

## 2020-08-27 NOTE — Assessment & Plan Note (Signed)
Shortness of breath Fatigue Decreased appetite Loss of taste and smell:   Stay well hydrated  Stay active  Deep breathing exercises  May take tylenol for fever or pain  Eat six small meals per day  Continue essential oils for olfactory re-training   Headaches:  May start magnesium 600 mg daily  May start Riboflavin 400 mg daily  Will place referral to neurology - Dr. Vickey Huger    Follow up:  Follow up in 3 months or sooner if needed

## 2020-09-05 ENCOUNTER — Telehealth (INDEPENDENT_AMBULATORY_CARE_PROVIDER_SITE_OTHER): Payer: BC Managed Care – PPO | Admitting: Family Medicine

## 2020-09-05 ENCOUNTER — Other Ambulatory Visit: Payer: Self-pay | Admitting: Family Medicine

## 2020-09-05 DIAGNOSIS — R059 Cough, unspecified: Secondary | ICD-10-CM | POA: Diagnosis not present

## 2020-09-05 DIAGNOSIS — R0981 Nasal congestion: Secondary | ICD-10-CM | POA: Diagnosis not present

## 2020-09-05 MED ORDER — BENZONATATE 100 MG PO CAPS: 100.0000 mg | ORAL_CAPSULE | Freq: Three times a day (TID) | ORAL | 0 refills | Status: DC | PRN

## 2020-09-05 NOTE — Patient Instructions (Addendum)
  HOME CARE TIPS:  -[facility] COVID19 testing information: [email or web address] OR [phone] Most pharmacies also offer testing and home test kits. If the Covid19 test is positive, please make a prompt follow up visit with your primary care office or with [facility] to discuss treatment options. Treatments for Covid19 are best given early in the course of the illness.   -I sent the medication(s) we discussed to your pharmacy: Meds ordered this encounter  Medications   benzonatate (TESSALON PERLES) 100 MG capsule    Sig: Take 1 capsule (100 mg total) by mouth 3 (three) times daily as needed.    Dispense:  20 capsule    Refill:  0     -can use tylenol or aleve if needed for fevers, aches and pains per instructions  -can use nasal saline a few times per day if you have nasal congestion; sometimes  a short course of Afrin nasal spray for 3 days can help with symptoms as well  -stay hydrated, drink plenty of fluids and eat small healthy meals - avoid dairy  -can take 1000 IU (25mcg) Vit D3 and 100-500 mg of Vit C daily per instructions  -If the Covid test is positive, check out the CDC website for more information on home care, transmission and treatment for COVID19  -follow up with your doctor in 2-3 days unless improving and feeling better  -stay home while sick, except to seek medical care. If you have COVID19, ideally it would be best to stay home for a full 10 days since the onset of symptoms PLUS one day of no fever and feeling better. Wear a good mask that fits snugly (such as N95 or KN95) if around others to reduce the risk of transmission.  It was nice to meet you today, and I really hope you are feeling better soon. I help [facility] out with telemedicine visits on Tuesdays and Thursdays and am available for visits on those days. If you have any concerns or questions following this visit please schedule a follow up visit with your Primary  Care doctor or seek care at a local urgent care clinic to avoid delays in care.    Seek in person care or schedule a follow up video visit promptly if your symptoms worsen, new concerns arise or you are not improving with treatment. Call 911 and/or seek emergency care if your symptoms are severe or life threatening.   

## 2020-09-05 NOTE — Progress Notes (Signed)
Virtual Visit via Video Note  I connected with [name] [name]  on 09/05/20 at 10:20 AM EDT by a video enabled telemedicine application and verified that I am speaking with the correct person using two identifiers.  Location patient: home, [place] Location provider:work or home office Persons participating in the virtual visit: patient, provider  I discussed the limitations of evaluation and management by telemedicine and the availability of in person appointments. The patient expressed understanding and agreed to proceed.   HPI:  Acute telemedicine visit for nasal congestion, cough: -Onset: 3-4 days ago -Symptoms include: congestion, cough, thick yellow mucus -Denies:CP, SOB, wheezing, NVD, inability to eat/drink/get out of bed -Pertinent past medical history: had COVID19 7 weeks ago - has persistent headaches and loss of taste and smell from this -Pertinent medication allergies: Allergies  Allergen Reactions  . Wellbutrin [Bupropion] Anaphylaxis  . Aripiprazole Swelling  . Statins Other (See Comments)    Myalgias with Crestor and Lipitor 8 yrs ago; MUSCLE ACHES   -COVID-19 vaccine status: vaccinated + boosted  ROS: See pertinent positives and negatives per HPI.  Past Medical History:  Diagnosis Date  . Atrial flutter with rapid ventricular response (HCC) 09/08/2011  . Detached retina   . High cholesterol   . Hyperlipemia 09/08/2011    Past Surgical History:  Procedure Laterality Date  . APPENDECTOMY    . ATRIAL FLUTTER ABLATION N/A 12/15/2011   Procedure: ATRIAL FLUTTER ABLATION;  Surgeon: Marinus Maw, MD;  Location[facility] CATH LAB;  Service: Cardiovascular;  Laterality: N/A;  . CARDIOVERSION  12/04/2011   Procedure: CARDIOVERSION;  Surgeon: Thurmon Fair, MD;  Location: [facility] ENDOSCOPY;  Service: Cardiovascular;  Laterality: N/A;  . TEE WITHOUT CARDIOVERSION  09/09/2011   Procedure: TRANSESOPHAGEAL ECHOCARDIOGRAM (TEE);  Surgeon: Thurmon Fair, MD;  Location: [facility] ENDOSCOPY;  Service:  Cardiovascular;  Laterality: N/A;  anes. not able to come fo cardioversion / pt TBA but in ER  . TEE WITHOUT CARDIOVERSION  12/04/2011   Procedure: TRANSESOPHAGEAL ECHOCARDIOGRAM (TEE);  Surgeon: Thurmon Fair, MD;  Location: [facility] ENDOSCOPY;  Service: Cardiovascular;  Laterality: N/A;  . TONSILLECTOMY    . TUBAL LIGATION       Current Outpatient Medications:  .  benzonatate (TESSALON PERLES) 100 MG capsule, Take 1 capsule (100 mg total) by mouth 3 (three) times daily as needed., Disp: 20 capsule, Rfl: 0 .  Ascorbic Acid (VITAMIN C) 1000 MG tablet, Take 1,000 mg by mouth daily., Disp: , Rfl:  .  aspirin 81 MG EC tablet, Take 81 mg by mouth daily., Disp: , Rfl:  .  b complex vitamins tablet, Take 1 tablet by mouth daily., Disp: , Rfl:  .  cholecalciferol (VITAMIN D) 1000 units tablet, Take 1,000 Units by mouth daily., Disp: , Rfl:  .  diazepam (VALIUM) 2 MG tablet, daily as needed. , Disp: , Rfl:  .  ezetimibe (ZETIA) 10 MG tablet, TAKE 1 TABLET(10 MG) BY MOUTH AT BEDTIME, Disp: 90 tablet, Rfl: 0 .  Multiple Vitamin (MULTIVITAMIN) tablet, Take 1 tablet by mouth daily., Disp: , Rfl:  .  Omega-3 Fatty Acids (FISH OIL) 1000 MG CAPS, Take by mouth., Disp: , Rfl:  .  rosuvastatin (CRESTOR) 5 MG tablet, TAKE 1 TABLET(5 MG) BY MOUTH AT BEDTIME, Disp: 90 tablet, Rfl: 3 .  venlafaxine (EFFEXOR) 37.5 MG tablet, Take 37.5 mg by mouth daily., Disp: , Rfl:  .  venlafaxine XR (EFFEXOR-XR) 75 MG 24 hr capsule, 1 capsule daily., Disp: , Rfl:   EXAM:  VITALS per patient  if applicable:  GENERAL: alert, oriented, appears well and in no acute distress  HEENT: atraumatic, conjunttiva clear, no obvious abnormalities on inspection of external nose and ears  NECK: normal movements of the head and neck  LUNGS: on inspection no signs of respiratory distress, breathing rate appears normal, no obvious gross SOB, gasping or wheezing  CV: no obvious cyanosis  MS: moves all visible extremities without noticeable  abnormality  PSYCH/NEURO: pleasant and cooperative, no obvious depression or anxiety, speech and thought processing grossly intact  ASSESSMENT AND PLAN:  Discussed the following assessment and plan:  Nasal congestion  Cough  -we discussed possible serious and likely etiologies, options for evaluation and workup, limitations of telemedicine visit vs in person visit, treatment, treatment risks and precautions. Pt prefers to treat via telemedicine empirically rather than in person at this moment.  Query viral upper respiratory illness versus other.  Doubt COVID-19 given recent COVID infection, but did advise that she test for COVID, she agrees to do this on her own.  Advised follow-up video visit if positive to discuss treatment options.  Otherwise she opted for nasal saline, short course of nasal decongestant and Tessalon for cough.  Other symptomatic care measures summarized in patient instructions. Work/School slipped offered:   declined Scheduled follow up with PCP offered: She opted to follow-up as needed. Advised to seek prompt in person care if worsening, new symptoms arise, or if is not improving with treatment. Discussed options for inperson care if PCP office not available. Did let this patient know that I only do telemedicine on Tuesdays and Thursdays for [place]. Advised to schedule follow up visit with PCP or [facility] if any further questions or concerns to avoid delays in care.   I discussed the assessment and treatment plan with the patient. The patient was provided an opportunity to ask questions and all were answered. The patient agreed with the plan and demonstrated an understanding of the instructions.     Terressa Koyanagi, DO

## 2020-09-23 ENCOUNTER — Other Ambulatory Visit: Payer: Self-pay | Admitting: Family Medicine

## 2020-10-01 ENCOUNTER — Other Ambulatory Visit: Payer: Self-pay | Admitting: Family Medicine

## 2020-10-10 ENCOUNTER — Ambulatory Visit (INDEPENDENT_AMBULATORY_CARE_PROVIDER_SITE_OTHER): Payer: BC Managed Care – PPO | Admitting: Family Medicine

## 2020-10-10 ENCOUNTER — Encounter: Payer: BC Managed Care – PPO | Admitting: Family Medicine

## 2020-10-10 ENCOUNTER — Other Ambulatory Visit: Payer: Self-pay

## 2020-10-10 ENCOUNTER — Encounter: Payer: Self-pay | Admitting: Family Medicine

## 2020-10-10 VITALS — BP 132/80 | HR 82 | Temp 98.3°F | Ht 68.0 in | Wt 196.4 lb

## 2020-10-10 DIAGNOSIS — Z8249 Family history of ischemic heart disease and other diseases of the circulatory system: Secondary | ICD-10-CM | POA: Diagnosis not present

## 2020-10-10 DIAGNOSIS — Z0000 Encounter for general adult medical examination without abnormal findings: Secondary | ICD-10-CM | POA: Diagnosis not present

## 2020-10-10 DIAGNOSIS — F339 Major depressive disorder, recurrent, unspecified: Secondary | ICD-10-CM

## 2020-10-10 DIAGNOSIS — Z23 Encounter for immunization: Secondary | ICD-10-CM

## 2020-10-10 DIAGNOSIS — Z8616 Personal history of COVID-19: Secondary | ICD-10-CM

## 2020-10-10 DIAGNOSIS — B0229 Other postherpetic nervous system involvement: Secondary | ICD-10-CM

## 2020-10-10 DIAGNOSIS — E7801 Familial hypercholesterolemia: Secondary | ICD-10-CM | POA: Diagnosis not present

## 2020-10-10 DIAGNOSIS — R439 Unspecified disturbances of smell and taste: Secondary | ICD-10-CM

## 2020-10-10 DIAGNOSIS — L570 Actinic keratosis: Secondary | ICD-10-CM

## 2020-10-10 LAB — LIPID PANEL
Cholesterol: 161 mg/dL (ref 0–200)
HDL: 62.5 mg/dL (ref >39.00)
LDL Cholesterol: 80 mg/dL (ref 0–99)
NonHDL: 98.7
Total CHOL/HDL Ratio: 3
Triglycerides: 96 mg/dL (ref 0.0–149.0)
VLDL: 19.2 mg/dL (ref 0.0–40.0)

## 2020-10-10 LAB — COMPREHENSIVE METABOLIC PANEL
ALT: 18 U/L (ref 0–35)
AST: 16 U/L (ref 0–37)
Albumin: 4.4 g/dL (ref 3.5–5.2)
Alkaline Phosphatase: 57 U/L (ref 39–117)
BUN: 19 mg/dL (ref 6–23)
CO2: 30 mEq/L (ref 19–32)
Calcium: 9.6 mg/dL (ref 8.4–10.5)
Chloride: 104 mEq/L (ref 96–112)
Creatinine, Ser: 0.78 mg/dL (ref 0.40–1.20)
GFR: 82.25 mL/min (ref >60.00)
Glucose, Bld: 83 mg/dL (ref 70–99)
Potassium: 4.5 mEq/L (ref 3.5–5.1)
Sodium: 141 mEq/L (ref 135–145)
Total Bilirubin: 0.5 mg/dL (ref 0.2–1.2)
Total Protein: 6.9 g/dL (ref 6.0–8.3)

## 2020-10-10 LAB — HEMOGLOBIN A1C: Hgb A1c MFr Bld: 5.7 % (ref 4.6–6.5)

## 2020-10-10 LAB — CBC WITH DIFFERENTIAL/PLATELET
Basophils Absolute: 0 10*3/uL (ref 0.0–0.1)
Basophils Relative: 0.2 % (ref 0.0–3.0)
Eosinophils Absolute: 0.2 10*3/uL (ref 0.0–0.7)
Eosinophils Relative: 3.6 % (ref 0.0–5.0)
HCT: 41.9 % (ref 36.0–46.0)
Hemoglobin: 14.2 g/dL (ref 12.0–15.0)
Lymphocytes Relative: 18.4 % (ref 12.0–46.0)
Lymphs Abs: 1.1 10*3/uL (ref 0.7–4.0)
MCHC: 33.7 g/dL (ref 30.0–36.0)
MCV: 92 fl (ref 78.0–100.0)
Monocytes Absolute: 0.4 10*3/uL (ref 0.1–1.0)
Monocytes Relative: 7.1 % (ref 3.0–12.0)
Neutro Abs: 4.4 10*3/uL (ref 1.4–7.7)
Neutrophils Relative %: 70.7 % (ref 43.0–77.0)
Platelets: 251 10*3/uL (ref 150.0–400.0)
RBC: 4.56 Mil/uL (ref 3.87–5.11)
RDW: 13.8 % (ref 11.5–15.5)
WBC: 6.2 10*3/uL (ref 4.0–10.5)

## 2020-10-10 LAB — TSH: TSH: 0.92 u[IU]/mL (ref 0.35–4.50)

## 2020-10-10 MED ORDER — BUSPIRONE HCL 15 MG PO TABS: 15.0000 mg | ORAL_TABLET | Freq: Three times a day (TID) | ORAL | 2 refills | Status: DC | PRN

## 2020-10-10 MED ORDER — FLUOROURACIL 5 % EX CREA: TOPICAL_CREAM | Freq: Two times a day (BID) | CUTANEOUS | 0 refills | Status: DC

## 2020-10-10 NOTE — Progress Notes (Signed)
Subjective  Chief Complaint  Patient presents with   Annual Exam    Not fasting    HPI: [name] [name] is a 60 y.o. female who presents to [facility] [place] today for a Female Wellness Visit.   Wellness Visit: annual visit with health maintenance review and exam without Pap  Health Maintenance- Diet changes due to having Covid in the past-Her taste and smell were effected. She is eating fairly healthy but she is interested in losing weight. Trying to improve her healthy eating choices. She has a rowing machine available, but has not yet used it. Screens are up to date. Eligable for Shringrx   Chronic and acute problem visit:  Blepharitis- Recently diagnosed Post COVID symptoms-She can taste salt, spicy, and ice cream([employer]) at times. For the first time she was able to smell lavender. Her taste and smell comes and goes randomly. It is now affecting her appetite-Currently only eating cheese for the salty taste. She had Covid in March which is when her taste and smell disappeared. Still having headaches but not on Saturday and returns on Sunday. Thinks it is related to her anxiety not rebound headaches from taking Excedrin. Denies having a headache at this time.   Anxiety/Stress-Reports having anxiety due to work and family issues(son and relationship) at this time.  Son with autism has recently moved home.  Some occasional headaches. Taking Valium 2 mg as needed but prefers not to take it regularly.  Tolerating well and compliant with no adverse symptoms noted.  Managed by psychiatry.  Taking Effexor.  Recently titrated up.  Denies panic attacks. Hammer Toe-Her big toes are bilaterally turning outward.  Clicking in Right Index Finger- Noticing stiffness and hearing clicking in her right index finger.  Some pain at the proximal joint Flaking skin lesions on left forearm.  Persistent.  Not growing or bleeding. Hyperlipidemia: Takes Zetia and statin.  Not fasting for  recheck today.  Tolerating well.  Premature history of cardiovascular disease in family.  Assessment  1. Annual physical exam   2. Familial hypercholesteremia   3. Family history of premature CAD   4. Chronic recurrent major depressive disorder (HCC)   5. Postherpetic neuralgia   6. Personal history of COVID-19   7. Actinic keratosis   8. Olfactory impairment      Plan  Female Wellness Visit: Age appropriate Health Maintenance and Prevention measures were discussed with patient. Included topics are cancer screening recommendations, ways to keep healthy (see AVS) including dietary and exercise recommendations, regular eye and dental care, use of seat belts, and avoidance of moderate alcohol use and tobacco use.  BMI: discussed patient's BMI and encouraged positive lifestyle modifications to help get to or maintain a target BMI.  Discussed diet and exercise plan HM needs and immunizations were addressed and ordered. See below for orders. See HM and immunization section for updates.  Shingrix first dose given today Routine labs and screening tests ordered including cmp, cbc and lipids where appropriate. Discussed recommendations regarding Vit D and calcium supplementation (see AVS)  Chronic problem visit: mixed hyperlipidemia: Recheck nonfasting lipids today on Zetia and Crestor.  Recheck liver test.  Tolerating well. Depression anxiety: Depression is fairly well controlled on Effexor.  Continue with psychiatry.  Recommend trial of BuSpar 3 times daily as needed for anxiety symptoms.  Will monitor headaches.  No red flag symptoms today. Personal history of COVID: Persistent olfactory and taste deficits.  She has appointment with neurology.  Hopefully  will improve over time.  She is 3 months out. Actinic keratosis: Trial of Efudex cream twice daily for 2 to 4 weeks.  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  Conservative treatment plan  discussed.   Follow up:  3-6 months to follow-up headaches anxiety if needed.  Orders Placed This Encounter  Procedures   Varicella-zoster vaccine IM (Shingrix)   CBC with Differential/Platelet   Comprehensive metabolic panel   Lipid panel   TSH   Hemoglobin A1c   Meds ordered this encounter  Medications   busPIRone (BUSPAR) 15 MG tablet    Sig: Take 1 tablet (15 mg total) by mouth 3 (three) times daily as needed.    Dispense:  60 tablet    Refill:  2   fluorouracil (EFUDEX) 5 % cream    Sig: Apply topically 2 (two) times daily. For 2-4 weeks    Dispense:  40 g    Refill:  0      Body mass index is 29.86 kg/m. Wt Readings from Last 3 Encounters:  10/10/20 196 lb 6.4 oz (89.1 kg)  08/27/20 198 lb (89.8 kg)  08/06/20 198 lb (89.8 kg)     Patient Active Problem List   Diagnosis Date Noted   Tardive dyskinesia 06/03/2018    Priority: High   Familial hypercholesteremia 07/26/2015    Priority: High   Family history of premature CAD 07/26/2012    Priority: High    Brother and father     Chronic recurrent major depressive disorder (HCC) 09/09/2011    Priority: High    [name], FNP psych; failed long term effexor, abilify and seroquel     Postherpetic neuralgia 06/06/2019    Priority: Medium   Retinal detachment 12/31/2011    Priority: Medium    Overview:  Dr Stephannie Li, retinal specialist     History of atrial flutter 09/29/2011    Priority: Medium    Overview:  Atrial flutter - s/p cardioversion x 2, s/p ablation 12/2011 [facility], Dr. Allyson Sabal     Atrophic vaginitis 06/03/2018    Priority: Low   AR (allergic rhinitis) 08/01/2010    Priority: Low   Olfactory impairment 08/27/2020   Chronic nonintractable headache 08/27/2020   Health Maintenance  Topic Date Due   COVID-19 Vaccine (4 - Booster for Pfizer series) 08/20/2020   INFLUENZA VACCINE  12/02/2020   Zoster Vaccines- Shingrix (2 of 2) 12/05/2020   MAMMOGRAM   06/27/2021   COLONOSCOPY (Pts 45-10yrs Insurance coverage will need to be confirmed)  03/22/2022   PAP SMEAR-Modifier  06/04/2023   TETANUS/TDAP  08/06/2023   Hepatitis C Screening  Completed   HIV Screening  Completed   Pneumococcal Vaccine 71-68 Years old  Aged Out   HPV VACCINES  Aged Out   Immunization History  Administered Date(s) Administered   Influenza,inj,Quad PF,6+ Mos 04/30/2017, 03/04/2018, 01/30/2019   PFIZER(Purple Top)SARS-COV-2 Vaccination 06/30/2019, 07/21/2019, 04/21/2020   Tdap 10/07/2010, 08/05/2013   Zoster Recombinat (Shingrix) 10/10/2020   We updated and reviewed the patient's past history in detail and it is documented below. Allergies: Patient is allergic to wellbutrin [bupropion], aripiprazole, and statins. Past Medical History Patient  has a past medical history of Atrial flutter with rapid ventricular response (HCC) (09/08/2011), Detached retina, High cholesterol, and Hyperlipemia (09/08/2011). Past Surgical History Patient  has a past surgical history that includes Appendectomy; Tonsillectomy; Tubal ligation; TEE without cardioversion (09/09/2011); TEE without cardioversion (12/04/2011); Cardioversion (12/04/2011); and Atrial flutter ablation (N/A,  12/15/2011). Family History: Patient family history includes Coronary artery disease in her brother and father; Hypertension in her mother; Stroke in her mother. Social History:  Patient  reports that she quit smoking about 19 years ago. Her smoking use included cigarettes. She has never used smokeless tobacco. She reports current alcohol use of about 2.0 standard drinks of alcohol per week. She reports that she does not use drugs.  Review of Systems: Constitutional: negative for fever or malaise Ophthalmic: negative for photophobia, double vision or loss of vision Cardiovascular: negative for chest pain, dyspnea on exertion, or new LE swelling Respiratory: negative for SOB or persistent cough Gastrointestinal: negative  for abdominal pain, change in bowel habits or melena Genitourinary: negative for dysuria or gross hematuria, no abnormal uterine bleeding or disharge Musculoskeletal: positive hammer toe on second toe. negative for new gait disturbance or muscular weakness,  Integumentary: negative for new or persistent rashes, no breast lumps Neurological: negative for TIA or stroke symptoms Psychiatric: positive increased anxiety. negative for SI or delusions Allergic/Immunologic: negative for hives  Patient Care Team    Relationship Specialty Notifications Start End  Willow Ora, MD PCP - General Family Medicine  04/30/17   Jodelle Red, MD Consulting Physician Cardiology  06/06/19   [name] FNP Consulting Physician Psychiatry  06/06/19     Objective  Vitals: BP 132/80   Pulse 82   Temp 98.3 F (36.8 C) (Temporal)   Ht 5\' 8"  (1.727 m)   Wt 196 lb 6.4 oz (89.1 kg)   SpO2 95%   BMI 29.86 kg/m  General:  Well developed, well nourished, no acute distress  Psych:  Alert and orientedx3,normal mood and affect HEENT:  Normocephalic, atraumatic, non-icteric sclera, supple neck without adenopathy, mass or thyromegaly Cardiovascular:  Normal S1, S2, RRR without gallop, rub or murmur Respiratory:  Good breath sounds bilaterally, CTAB with normal respiratory effort Gastrointestinal: normal bowel sounds, soft, non-tender, no noted masses. No HSM MSK: no deformities, contusions. Joints are without erythema or swelling. Hammer toe second toe with lateral deviation of bilateral great toes Skin:  Warm, 2 small flaking erythematous lesions on left forearm consistent with AK's Neurologic:    Mental status is normal. CN 2-11 are normal. Gross motor and sensory exams are normal. Normal gait. No tremor  Pelvic Exam: Normal external genitalia, no vulvar or vaginal lesions present. Clear cervix w/o CMT. Bimanual exam reveals a nontender fundus w/o masses, nl size. No adnexal masses present. No  inguinal adenopathy. A PAP smear was not performed.  Commons side effects, risks, benefits, and alternatives for medications and treatment plan prescribed today were discussed, and the patient expressed understanding of the given instructions. Patient is instructed to call or message via MyChart if he/she has any questions or concerns regarding our treatment plan. No barriers to understanding were identified. We discussed Red Flag symptoms and signs in detail. Patient expressed understanding regarding what to do in case of urgent or emergency type symptoms.  Medication list was reconciled, printed and provided to the patient in AVS. Patient instructions and summary information was reviewed with the patient as documented in the AVS. This note was prepared with assistance of Dragon voice recognition software. Occasional wrong-word or sound-a-like substitutions may have occurred due to the inherent limitations of voice recognition software  This visit occurred during the SARS-CoV-2 public health emergency.  Safety protocols were in place, including screening questions prior to the visit, additional usage of staff PPE, and extensive cleaning of exam room while  observing appropriate contact time as indicated for disinfecting solutions.   I,Alexis Bryant,acting as a Neurosurgeon for Willow Ora, MD.,have documented all relevant documentation on the behalf of Willow Ora, MD,as directed by  Willow Ora, MD while in the presence of Willow Ora, MD.  I, Willow Ora, MD, have reviewed all documentation for this visit. The documentation on 10/10/20 for the exam, diagnosis, procedures, and orders are all accurate and complete.

## 2020-10-10 NOTE — Patient Instructions (Signed)
Please return in 3-6 months for anxiety/headaches if needed.   I will release your lab results to you on your MyChart account with further instructions. Please reply with any questions.    If you have any questions or concerns, please don't hesitate to send me a message via MyChart or call the office at [phone]. Thank you for visiting with [place] today! It's our pleasure caring for you.

## 2020-11-27 ENCOUNTER — Ambulatory Visit (INDEPENDENT_AMBULATORY_CARE_PROVIDER_SITE_OTHER): Payer: BC Managed Care – PPO | Admitting: Nurse Practitioner

## 2020-11-27 VITALS — BP 114/64 | HR 71 | Temp 97.9°F

## 2020-11-27 DIAGNOSIS — R519 Headache, unspecified: Secondary | ICD-10-CM

## 2020-11-27 DIAGNOSIS — G8929 Other chronic pain: Secondary | ICD-10-CM

## 2020-11-27 DIAGNOSIS — R439 Unspecified disturbances of smell and taste: Secondary | ICD-10-CM

## 2020-11-27 DIAGNOSIS — Z8616 Personal history of COVID-19: Secondary | ICD-10-CM | POA: Diagnosis not present

## 2020-11-27 NOTE — Progress Notes (Signed)
@Patient  ID: , female    DOB: 1960/11/06, 60 y.o.   MRN: [id number]  Chief Complaint  Patient presents with   Follow-up    Referring provider: [id number], MD  HPI  Patient presents today for post-COVID care clinic visit follow-up.  Patient was last seen in our office on 08/27/2020.  At her last visit here she was having issues with altered taste and smell, fatigue, decreased appetite, headaches. She does have an appointment scheduled with neurology for tomorrow.  She states that overall she has improved.  Her altered taste and smell has improved.  She does at times have a sensation of smelling "dead flowers".  She states that her headaches are improving.  She states that her appetite has improved.  She also states that she has become more active.    Allergies  Allergen Reactions   Wellbutrin [Bupropion] Anaphylaxis   Aripiprazole Swelling   Statins Other (See Comments)    Myalgias with Crestor and Lipitor 8 yrs ago; MUSCLE ACHES    Immunization History  Administered Date(s) Administered   Influenza,inj,Quad PF,6+ Mos 04/30/2017, 03/04/2018, 01/30/2019   PFIZER(Purple Top)SARS-COV-2 Vaccination 06/30/2019, 07/21/2019, 04/21/2020   Tdap 10/07/2010, 08/05/2013   Zoster Recombinat (Shingrix) 10/10/2020    Past Medical History:  Diagnosis Date   Atrial flutter with rapid ventricular response (HCC) 09/08/2011   Detached retina    High cholesterol    Hyperlipemia 09/08/2011    Tobacco History: Social History   Tobacco Use  Smoking Status Former   Types: Cigarettes   Quit date: 05/04/2001   Years since quitting: 19.5  Smokeless Tobacco Never   Counseling given: Yes   Outpatient Encounter Medications as of 11/27/2020  Medication Sig   Ascorbic Acid (VITAMIN C) 1000 MG tablet Take 1,000 mg by mouth daily.   aspirin 81 MG EC tablet Take 81 mg by mouth daily.   busPIRone (BUSPAR) 15 MG tablet Take 1 tablet (15 mg total) by mouth 3 (three) times daily as needed.    cholecalciferol (VITAMIN D) 1000 units tablet Take 1,000 Units by mouth daily.   Cyanocobalamin (VITAMIN B-12 PO) Take by mouth.   diazepam (VALIUM) 2 MG tablet daily as needed.    ezetimibe (ZETIA) 10 MG tablet TAKE 1 TABLET(10 MG) BY MOUTH AT BEDTIME   fluorouracil (EFUDEX) 5 % cream Apply topically 2 (two) times daily. For 2-4 weeks   MAGNESIUM PO Take by mouth.   Multiple Vitamin (MULTIVITAMIN) tablet Take 1 tablet by mouth daily.   Omega-3 Fatty Acids (FISH OIL) 1000 MG CAPS Take by mouth.   rosuvastatin (CRESTOR) 5 MG tablet TAKE 1 TABLET(5 MG) BY MOUTH AT BEDTIME   venlafaxine (EFFEXOR) 37.5 MG tablet Take 37.5 mg by mouth daily.   venlafaxine XR (EFFEXOR-XR) 75 MG 24 hr capsule 1 capsule daily.   No facility-administered encounter medications on file as of 11/27/2020.     Review of Systems  Review of Systems  Constitutional: Negative.  Negative for fever.  HENT: Negative.    Respiratory:  Negative for cough and shortness of breath.   Cardiovascular: Negative.   Gastrointestinal: Negative.   Allergic/Immunologic: Negative.   Neurological: Negative.   Psychiatric/Behavioral: Negative.        Physical Exam  BP 114/64   Pulse 71   Temp 97.9 F (36.6 C)   SpO2 97%   Wt Readings from Last 5 Encounters:  10/10/20 196 lb 6.4 oz (89.1 kg)  08/27/20 198 lb (89.8 kg)  08/06/20 198 lb (89.8  kg)  06/06/19 205 lb 12.8 oz (93.4 kg)  01/30/19 203 lb 9.6 oz (92.4 kg)     Physical Exam Vitals and nursing note reviewed.  Constitutional:      General: She is not in acute distress.    Appearance: She is well-developed.  Cardiovascular:     Rate and Rhythm: Normal rate and regular rhythm.  Pulmonary:     Effort: Pulmonary effort is normal.     Breath sounds: Normal breath sounds.  Musculoskeletal:     Right lower leg: No edema.     Left lower leg: No edema.  Neurological:     Mental Status: She is alert and oriented to person, place, and time.  Psychiatric:        Mood  and Affect: Mood normal.        Behavior: Behavior normal.       Assessment & Plan:   History of COVID-19 Shortness of breath Fatigue Decreased appetite Loss of taste and smell:   Overall - much improved   Stay well hydrated   Stay active   Deep breathing exercises   May take tylenol for fever or pain   Eat six small meals per day       Headaches:    May keep appointment with neurology - Dr. Vickey Huger - if needed       Follow up:   Follow up if needed         Ivonne Andrew, NP 11/28/2020

## 2020-11-27 NOTE — Patient Instructions (Addendum)
History of COVID-19 Shortness of breath Fatigue Decreased appetite Loss of taste and smell:     Stay well hydrated   Stay active   Deep breathing exercises   May take tylenol for fever or pain   Eat six small meals per day       Headaches:    May keep appointment with neurology - Dr. Vickey Huger - if needed       Follow up:   Follow up if needed

## 2020-11-28 ENCOUNTER — Ambulatory Visit: Payer: Self-pay | Admitting: Neurology

## 2020-11-28 NOTE — Assessment & Plan Note (Signed)
Shortness of breath Fatigue Decreased appetite Loss of taste and smell:   Overall - much improved   Stay well hydrated   Stay active   Deep breathing exercises   May take tylenol for fever or pain   Eat six small meals per day       Headaches:    May keep appointment with neurology - Dr. Vickey Huger - if needed       Follow up:   Follow up if needed

## 2020-12-03 ENCOUNTER — Telehealth: Payer: Self-pay

## 2020-12-03 NOTE — Telephone Encounter (Signed)
Nurse Assessment Nurse: Berna Bue, RN, Megan Date/Time (Eastern Time): 12/02/2020 4:03:55 PM Confirm and document reason for call. If symptomatic, describe symptoms. ---Caller states she has back pain, abdominal cramping and diarrhea, she thinks she ate bad fish last night. This started about 1am. Temp 99.4, chills and sweating. She is feeling dizzy and has a headache. She had smoke salmon last night for dinner. OTC covid test negative Does the patient have any new or worsening symptoms? ---Yes Will a triage be completed? ---Yes Related visit to physician within the last 2 weeks? ---No Does the PT have any chronic conditions? (i.e. diabetes, asthma, this includes High risk factors for pregnancy, etc.) ---Yes List chronic conditions. ---high cholesterol, depression Is this a behavioral health or substance abuse call? ---No Guidelines Guideline Title Affirmed Question Affirmed Notes Nurse Date/Time (Eastern Time) Diarrhea [1] Drinking very little AND [2] dehydration suspected (e.g., no urine > 12 hours, Berna Bue, RN, Aundra Millet 12/02/2020 4:05:42 PM PLEASE NOTE: All timestamps contained within this report are represented as [place] Standard Time. CONFIDENTIALTY NOTICE: This fax transmission is intended only for the addressee. It contains information that is legally privileged, confidential or otherwise protected from use or disclosure. If you are not the intended recipient, you are strictly prohibited from reviewing, disclosing, copying using or disseminating any of this information or taking any action in reliance on or regarding this information. If you have received this fax in error, please notify us immediately by telephone so that we can arrange for its return to [place]. Phone: [phone], Toll-Free: [phone], Fax: [phone] Page: 2 of 2 Call Id: [id number] Guidelines Guideline Title Affirmed Question Affirmed Notes Nurse Date/Time Lamount Cohen Time) very dry mouth,  very lightheaded) Disp. Time Lamount Cohen Time) Disposition Final User 12/02/2020 4:09:16 PM Go to ED Now (or PCP triage) Yes Berna Bue, RN, Megan Caller Disagree/Comply Comply Caller Understands Yes PreDisposition Go to ED Care Advice Given Per Guideline GO TO ED NOW (OR PCP TRIAGE): * IF NO PCP (PRIMARY CARE PROVIDER) SECOND-LEVEL TRIAGE: You need to be seen within the next hour. Go to the ED/UCC at _____________ Hospital. Leave as soon as you can. ANOTHER ADULT SHOULD DRIVE: * It is better and safer if another adult drives instead of you. CARE ADVICE given per Diarrhea (Adult) guideline. Referrals GO TO FACILITY OTHER - SPECIF

## 2020-12-03 NOTE — Telephone Encounter (Signed)
I spoke with the pt to  follow up on symptoms. She says that she is drinking Gatorade, and starting to feel better. She states that she doesn't know if she needs an antibiotic with what she assumes to be food poisoning. She denies fever, chills, and vomiting today. She refused to go to [facility] or ED due to costs. I advised her to follow up in office if symptoms worsen or fail to improve. She voiced understanding.

## 2020-12-10 ENCOUNTER — Other Ambulatory Visit: Payer: Self-pay

## 2020-12-10 ENCOUNTER — Ambulatory Visit (INDEPENDENT_AMBULATORY_CARE_PROVIDER_SITE_OTHER): Payer: BC Managed Care – PPO | Admitting: *Deleted

## 2020-12-10 DIAGNOSIS — Z23 Encounter for immunization: Secondary | ICD-10-CM | POA: Diagnosis not present

## 2020-12-10 NOTE — Progress Notes (Signed)
Patient present for #2 shingle vaccine Vaccine given on left deltoid IM  Patient tolerated well

## 2020-12-29 ENCOUNTER — Other Ambulatory Visit: Payer: Self-pay | Admitting: Family Medicine

## 2021-06-06 ENCOUNTER — Other Ambulatory Visit: Payer: Self-pay

## 2021-06-06 ENCOUNTER — Ambulatory Visit: Payer: BC Managed Care – PPO | Admitting: Family Medicine

## 2021-06-06 ENCOUNTER — Encounter: Payer: Self-pay | Admitting: Family Medicine

## 2021-06-06 VITALS — BP 110/66 | HR 77 | Temp 98.1°F | Ht 68.0 in | Wt 174.4 lb

## 2021-06-06 DIAGNOSIS — Z8639 Personal history of other endocrine, nutritional and metabolic disease: Secondary | ICD-10-CM

## 2021-06-06 DIAGNOSIS — B9689 Other specified bacterial agents as the cause of diseases classified elsewhere: Secondary | ICD-10-CM | POA: Diagnosis not present

## 2021-06-06 DIAGNOSIS — F339 Major depressive disorder, recurrent, unspecified: Secondary | ICD-10-CM | POA: Diagnosis not present

## 2021-06-06 DIAGNOSIS — J208 Acute bronchitis due to other specified organisms: Secondary | ICD-10-CM | POA: Diagnosis not present

## 2021-06-06 MED ORDER — AZITHROMYCIN 250 MG PO TABS: ORAL_TABLET | ORAL | 0 refills | Status: DC

## 2021-06-06 MED ORDER — PREDNISONE 10 MG PO TABS: ORAL_TABLET | ORAL | 0 refills | Status: DC

## 2021-06-06 NOTE — Patient Instructions (Signed)
Please return in June for your annual complete physical; please come fasting.   If you have any questions or concerns, please don't hesitate to send me a message via MyChart or call the office at [phone]. Thank you for visiting with [place] today! It's our pleasure caring for you.   Acute Bronchitis, Adult Acute bronchitis is sudden inflammation of the main airways (bronchi) that come off the windpipe (trachea) in the lungs. The swelling causes the airways to get smaller and make more mucus than normal. This can make it hard to breathe and can cause coughing or noisy breathing (wheezing). Acute bronchitis may last several weeks. The cough may last longer. Allergies, asthma, and exposure to smoke may make the condition worse. What are the causes? This condition can be caused by germs and by substances that irritate the lungs, including: Cold and flu viruses. The most common cause of this condition is the virus that causes the common cold. Bacteria. This is less common. Breathing in substances that irritate the lungs, including: Smoke from cigarettes and other forms of tobacco. Dust and pollen. Fumes from household cleaning products, gases, or burned fuel. Indoor or outdoor air pollution. What increases the risk? The following factors may make you more likely to develop this condition: A weak body's defense system, also called the immune system. A condition that affects your lungs and breathing, such as asthma. What are the signs or symptoms? Common symptoms of this condition include: Coughing. This may bring up clear, yellow, or green mucus from your lungs (sputum). Wheezing. Runny or stuffy nose. Having too much mucus in your lungs (chest congestion). Shortness of breath. Aches and pains, including sore throat or chest. How is this diagnosed? This condition is usually diagnosed based on: Your symptoms and medical history. A physical exam. You may also have other tests, including tests  to rule out other conditions, such as pneumonia. These tests include: A test of lung function. Test of a mucus sample to look for the presence of bacteria. Tests to check the oxygen level in your blood. Blood tests. Chest X-ray. How is this treated? Most cases of acute bronchitis clear up over time without treatment. Your health care provider may recommend: Drinking more fluids to help thin your mucus so it is easier to cough up. Taking inhaled medicine (inhaler) to improve air flow in and out of your lungs. Using a vaporizer or a humidifier. These are machines that add water to the air to help you breathe better. Taking a medicine that thins mucus and clears congestion (expectorant). Taking a medicine that prevents or stops coughing (cough suppressant). It is notcommon to take an antibiotic medicine for this condition. Follow these instructions at home:  Take over-the-counter and prescription medicines only as told by your health care provider. Use an inhaler, vaporizer, or humidifier as told by your health care provider. Take two teaspoons (10 mL) of honey at bedtime to lessen coughing at night. Drink enough fluid to keep your urine pale yellow. Do not use any products that contain nicotine or tobacco. These products include cigarettes, chewing tobacco, and vaping devices, such as e-cigarettes. If you need help quitting, ask your health care provider. Get plenty of rest. Return to your normal activities as told by your health care provider. Ask your health care provider what activities are safe for you. Keep all follow-up visits. This is important. How is this prevented? To lower your risk of getting this condition again: Wash your hands often with soap and  water for at least 20 seconds. If soap and water are not available, use hand sanitizer. Avoid contact with people who have cold symptoms. Try not to touch your mouth, nose, or eyes with your hands. Avoid breathing in smoke or  chemical fumes. Breathing smoke or chemical fumes will make your condition worse. Get the flu shot every year. Contact a health care provider if: Your symptoms do not improve after 2 weeks. You have trouble coughing up the mucus. Your cough keeps you awake at night. You have a fever. Get help right away if you: Cough up blood. Feel pain in your chest. Have severe shortness of breath. Faint or keep feeling like you are going to faint. Have a severe headache. Have a fever or chills that get worse. These symptoms may represent a serious problem that is an emergency. Do not wait to see if the symptoms will go away. Get medical help right away. Call your local emergency services (911 in the [place] Do not drive yourself to the hospital. Summary Acute bronchitis is inflammation of the main airways (bronchi) that come off the windpipe (trachea) in the lungs. The swelling causes the airways to get smaller and make more mucus than normal. Drinking more fluids can help thin your mucus so it is easier to cough up. Take over-the-counter and prescription medicines only as told by your health care provider. Do not use any products that contain nicotine or tobacco. These products include cigarettes, chewing tobacco, and vaping devices, such as e-cigarettes. If you need help quitting, ask your health care provider. Contact a health care provider if your symptoms do not improve after 2 weeks. This information is not intended to replace advice given to you by your health care provider. Make sure you discuss any questions you have with your health care provider. Document Revised: 08/21/2020 Document Reviewed: 08/21/2020 Elsevier Patient Education  2022 [employer].

## 2021-06-06 NOTE — Progress Notes (Signed)
Subjective  CC:  Chief Complaint  Patient presents with   Cough    Started Tuesday, getting worse can feel stabbing pain when breathing Feels like cold is in lungs Took Excedrin for daily headaches Productive cough with yellow/green mucus       HPI: SUBJECTIVE:  [name] [name] is a 61 y.o. female who complains 5 to 6-day history of mild sore throat, deep hacking productive cough, pain with deep inspiration, mild tightness in the chest without audible wheezing.  She denies fevers or ear pain.  No significant sinus pain or congestion.  Has history of bronchitis but it been many many years ago.  He has reactive wheezing with upper respiratory infection but no history of asthma.  She has a remote history of smoking.  Symptoms started with malaise and sore throat 6 days ago.  She has taken 3 COVID test most recent being last night, all have been negative.  She is immunized.  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  She denies GI symptoms, no nausea vomiting or diarrhea.  No myalgias.  No shortness of breath.  Has lost 25 pounds using [employer] program.  Feels great.  Chronic major depression is doing very well on Effexor 112.5 mg daily.  She really enjoys Forensic scientistteaching art.  She is happy most days of the week now.  No adverse effects.  Assessment  1. Acute bacterial bronchitis   2. History of obesity   3. Chronic recurrent major depressive disorder (HCC)      Plan  Discussion:  Treat for bacterial bronchitis due to localized symptoms associated with productive cough and pleuritic chest pain.  No wheezing or rales on exam today.  Printed prescription for prednisone to use if wheezing or tightness in the chest or pain worsens.. Education regarding differences between viral and bacterial infections and treatment options are discussed.  Supportive care measures are recommended.  We discussed the use of mucolytic's, decongestants, antihistamines and antitussives  as needed.  Tylenol or Advil are recommended if needed.  History of obesity: Significant weight loss with healthier diet and eating program.  Praised  Chronic major depressive now well controlled on Effexor.  Follow up: June for complete physical  No orders of the defined types were placed in this encounter.  Meds ordered this encounter  Medications   azithromycin (ZITHROMAX) 250 MG tablet    Sig: Take 2 tabs today, then 1 tab daily for 4 days    Dispense:  1 each    Refill:  0   predniSONE (DELTASONE) 10 MG tablet    Sig: Take 4 tabs qd x 2 days, 3 qd x 2 days, 2 qd x 2d, 1qd x 3 days    Dispense:  21 tablet    Refill:  0      I reviewed the patients updated PMH, FH, and SocHx.  Social History: Patient  reports that she quit smoking about 20 years ago. Her smoking use included cigarettes. She has never used smokeless tobacco. She reports current alcohol use of about 2.0 standard drinks per week. She reports that she does not use drugs.  Patient Active Problem List   Diagnosis Date Noted   Tardive dyskinesia 06/03/2018    Priority: High   Familial hypercholesteremia 07/26/2015    Priority: High   Family history of premature CAD 07/26/2012    Priority: High   Chronic recurrent major depressive disorder (HCC) 09/09/2011    Priority: High  Postherpetic neuralgia 06/06/2019    Priority: Medium    Retinal detachment 12/31/2011    Priority: Medium    History of atrial flutter 09/29/2011    Priority: Medium    Atrophic vaginitis 06/03/2018    Priority: Low   AR (allergic rhinitis) 08/01/2010    Priority: Low    Review of Systems: Cardiovascular: negative for chest pain Respiratory: negative for SOB or hemoptysis Gastrointestinal: negative for abdominal pain Genitourinary: negative for dysuria or gross hematuria Current Meds  Medication Sig   Ascorbic Acid (VITAMIN C) 1000 MG tablet Take 1,000 mg by mouth daily.   aspirin 81 MG EC tablet Take 81 mg by mouth daily.    azithromycin (ZITHROMAX) 250 MG tablet Take 2 tabs today, then 1 tab daily for 4 days   cholecalciferol (VITAMIN D) 1000 units tablet Take 1,000 Units by mouth daily.   Cyanocobalamin (VITAMIN B-12 PO) Take by mouth.   diazepam (VALIUM) 5 MG tablet Take 2.5-5 mg by mouth daily.   ezetimibe (ZETIA) 10 MG tablet TAKE 1 TABLET(10 MG) BY MOUTH AT BEDTIME   fluorouracil (EFUDEX) 5 % cream Apply topically 2 (two) times daily. For 2-4 weeks   Multiple Vitamin (MULTIVITAMIN) tablet Take 1 tablet by mouth daily.   Omega-3 Fatty Acids (FISH OIL) 1000 MG CAPS Take by mouth.   predniSONE (DELTASONE) 10 MG tablet Take 4 tabs qd x 2 days, 3 qd x 2 days, 2 qd x 2d, 1qd x 3 days   rosuvastatin (CRESTOR) 5 MG tablet TAKE 1 TABLET(5 MG) BY MOUTH AT BEDTIME   venlafaxine XR (EFFEXOR-XR) 37.5 MG 24 hr capsule Take 112.5 mg by mouth every morning.    Objective  Vitals: BP 110/66    Pulse 77    Temp 98.1 F (36.7 C) (Temporal)    Ht 5\' 8"  (1.727 m)    Wt 174 lb 6 oz (79.1 kg)    SpO2 97%    BMI 26.51 kg/m  General: no acute distress, no respiratory distress, mild cough intermittently. Psych:  Alert and oriented, normal mood and affect HEENT:  Normocephalic, atraumatic, supple neck, moist mucous membranes, no lymphadenopathy, supple neck Cardiovascular:  RRR without murmur. no edema Respiratory:  Good breath sounds bilaterally, CTAB with normal respiratory effort without rhonchi, wheezing or rales Skin:  Warm, no rashes Neurologic:   Mental status is normal. normal gait  Commons side effects, risks, benefits, and alternatives for medications and treatment plan prescribed today were discussed, and the patient expressed understanding of the given instructions. Patient is instructed to call or message via MyChart if he/she has any questions or concerns regarding our treatment plan. No barriers to understanding were identified. We discussed Red Flag symptoms and signs in detail. Patient expressed understanding  regarding what to do in case of urgent or emergency type symptoms.  Medication list was reconciled, printed and provided to the patient in AVS. Patient instructions and summary information was reviewed with the patient as documented in the AVS. This note was prepared with assistance of Dragon voice recognition software. Occasional wrong-word or sound-a-like substitutions may have occurred due to the inherent limitations of voice recognition software

## 2021-06-17 ENCOUNTER — Encounter: Payer: Self-pay | Admitting: Family Medicine

## 2021-06-17 ENCOUNTER — Ambulatory Visit: Payer: BC Managed Care – PPO | Admitting: Family Medicine

## 2021-06-17 VITALS — BP 120/70 | HR 96 | Temp 98.1°F | Ht 67.0 in | Wt 178.2 lb

## 2021-06-17 DIAGNOSIS — J0100 Acute maxillary sinusitis, unspecified: Secondary | ICD-10-CM

## 2021-06-17 DIAGNOSIS — H1032 Unspecified acute conjunctivitis, left eye: Secondary | ICD-10-CM

## 2021-06-17 DIAGNOSIS — R051 Acute cough: Secondary | ICD-10-CM

## 2021-06-17 MED ORDER — AMOXICILLIN-POT CLAVULANATE 875-125 MG PO TABS: 1.0000 | ORAL_TABLET | Freq: Two times a day (BID) | ORAL | 0 refills | Status: DC

## 2021-06-17 MED ORDER — CIPROFLOXACIN HCL 0.3 % OP SOLN: 2.0000 [drp] | OPHTHALMIC | 0 refills | Status: DC

## 2021-06-17 MED ORDER — ALBUTEROL SULFATE HFA 108 (90 BASE) MCG/ACT IN AERS: 2.0000 | INHALATION_SPRAY | Freq: Four times a day (QID) | RESPIRATORY_TRACT | 2 refills | Status: DC | PRN

## 2021-06-17 NOTE — Patient Instructions (Signed)
Meds have been sent the the pharmacy °You can take tylenol for pain/fevers °If worsening symptoms, let us know or go to the Emergency room  ° ° °

## 2021-06-17 NOTE — Progress Notes (Signed)
Subjective:     Patient ID: [name] [name], female    DOB: Feb 18, 1961, 61 y.o.   MRN: [id number]  Chief Complaint  Patient presents with   Cough    Has gotten worse, felt better for one day after completing medications Green phlegm    Nasal Congestion    HPI Cough has worsened.  Seen 2/3 and placed on zpk and pred.  Not getting better and phlegm getting worse-green.  Coughing more.  Some sob.  11 days ago was getting sharp pain in chest when coughed-getting better but still there.   Sinuses/teeth No f/c/n/v.  Some runny nose/congestion.  Mucinex and nyquil at hs-wakes up coughing.   Health Maintenance Due  Topic Date Due   COVID-19 Vaccine (4 - Booster for Pfizer series) 06/16/2020    Past Medical History:  Diagnosis Date   Atrial flutter with rapid ventricular response (HCC) 09/08/2011   Detached retina    High cholesterol    Hyperlipemia 09/08/2011    Past Surgical History:  Procedure Laterality Date   APPENDECTOMY     ATRIAL FLUTTER ABLATION N/A 12/15/2011   Procedure: ATRIAL FLUTTER ABLATION;  Surgeon: Marinus Maw, MD;  Location: [facility] CATH LAB;  Service: Cardiovascular;  Laterality: N/A;   CARDIOVERSION  12/04/2011   Procedure: CARDIOVERSION;  Surgeon: Thurmon Fair, MD;  Location: [facility] ENDOSCOPY;  Service: Cardiovascular;  Laterality: N/A;   TEE WITHOUT CARDIOVERSION  09/09/2011   Procedure: TRANSESOPHAGEAL ECHOCARDIOGRAM (TEE);  Surgeon: Thurmon Fair, MD;  Location: [facility] ENDOSCOPY;  Service: Cardiovascular;  Laterality: N/A;  anes. not able to come fo cardioversion / pt TBA but in ER   TEE WITHOUT CARDIOVERSION  12/04/2011   Procedure: TRANSESOPHAGEAL ECHOCARDIOGRAM (TEE);  Surgeon: Thurmon Fair, MD;  Location: [facility] ENDOSCOPY;  Service: Cardiovascular;  Laterality: N/A;   TONSILLECTOMY     TUBAL LIGATION      Outpatient Medications Prior to Visit  Medication Sig Dispense Refill   Ascorbic Acid (VITAMIN C) 1000 MG tablet Take 1,000 mg by mouth daily.     aspirin 81 MG EC  tablet Take 81 mg by mouth daily.     cholecalciferol (VITAMIN D) 1000 units tablet Take 1,000 Units by mouth daily.     Cyanocobalamin (VITAMIN B-12 PO) Take by mouth.     diazepam (VALIUM) 5 MG tablet Take 2.5-5 mg by mouth daily.     ezetimibe (ZETIA) 10 MG tablet TAKE 1 TABLET(10 MG) BY MOUTH AT BEDTIME 90 tablet 3   fluorouracil (EFUDEX) 5 % cream Apply topically 2 (two) times daily. For 2-4 weeks 40 g 0   Multiple Vitamin (MULTIVITAMIN) tablet Take 1 tablet by mouth daily.     Omega-3 Fatty Acids (FISH OIL) 1000 MG CAPS Take by mouth.     rosuvastatin (CRESTOR) 5 MG tablet TAKE 1 TABLET(5 MG) BY MOUTH AT BEDTIME 90 tablet 3   venlafaxine XR (EFFEXOR-XR) 37.5 MG 24 hr capsule Take 112.5 mg by mouth every morning.     azithromycin (ZITHROMAX) 250 MG tablet Take 2 tabs today, then 1 tab daily for 4 days (Patient not taking: Reported on 06/17/2021) 1 each 0   predniSONE (DELTASONE) 10 MG tablet Take 4 tabs qd x 2 days, 3 qd x 2 days, 2 qd x 2d, 1qd x 3 days (Patient not taking: Reported on 06/17/2021) 21 tablet 0   No facility-administered medications prior to visit.    Allergies  Allergen Reactions   Wellbutrin [Bupropion] Anaphylaxis   Aripiprazole Swelling   Statins  Other (See Comments)    Myalgias with Crestor and Lipitor 8 yrs ago; MUSCLE ACHES   OMB:[id number] except as noted in HPI      Objective:     BP 120/70    Pulse 96    Temp 98.1 F (36.7 C) (Temporal)    Ht 5\' 7"  (1.702 m)    Wt 178 lb 4 oz (80.9 kg)    SpO2 96%    BMI 27.92 kg/m  Wt Readings from Last 3 Encounters:  06/17/21 178 lb 4 oz (80.9 kg)  06/06/21 174 lb 6 oz (79.1 kg)  10/10/20 196 lb 6.4 oz (89.1 kg)        Gen: WDWN NAD WF HEENT: NCAT, conjunctivaL injected, sclera nonicteric TM WNL B, OP moist, no exudates  sinuses tender B maxillary NECK:  supple, no thyromegaly, no nodes, no carotid bruits CARDIAC: RRR, S1S2+, no murmur.  LUNGS: CTAB. No wheezes EXT:  no edema MSK: no gross  abnormalities.  NEURO: A&O x3.  CN II-XII intact.  PSYCH: normal mood. Good eye contact  Assessment & Plan:   Problem List Items Addressed This Visit   None Visit Diagnoses     Acute non-recurrent maxillary sinusitis    -  Primary   Relevant Medications   amoxicillin-clavulanate (AUGMENTIN) 875-125 MG tablet   Acute cough       Acute bacterial conjunctivitis of left eye          Sinusitis-augmentin Cough-add albuterol. Conjunctivitis L-cipro  Meds ordered this encounter  Medications   amoxicillin-clavulanate (AUGMENTIN) 875-125 MG tablet    Sig: Take 1 tablet by mouth 2 (two) times daily.    Dispense:  20 tablet    Refill:  0   albuterol (VENTOLIN HFA) 108 (90 Base) MCG/ACT inhaler    Sig: Inhale 2 puffs into the lungs every 6 (six) hours as needed for wheezing or shortness of breath.    Dispense:  8 g    Refill:  2   ciprofloxacin (CILOXAN) 0.3 % ophthalmic solution    Sig: Place 2 drops into both eyes every 4 (four) hours while awake. Administer 1 drop, every 2 hours, while awake, for 2 days. Then 1 drop, every 4 hours, while awake, for the next 5 days.    Dispense:  5 mL    Refill:  0    12/10/20, MD

## 2021-07-03 LAB — HM MAMMOGRAPHY

## 2021-07-17 ENCOUNTER — Encounter: Payer: Self-pay | Admitting: Family Medicine

## 2021-08-07 ENCOUNTER — Encounter: Payer: Self-pay | Admitting: Family

## 2021-08-07 ENCOUNTER — Ambulatory Visit: Payer: BC Managed Care – PPO | Admitting: Family

## 2021-08-07 VITALS — BP 110/78 | HR 83 | Temp 98.4°F | Ht 67.0 in | Wt 175.0 lb

## 2021-08-07 DIAGNOSIS — J209 Acute bronchitis, unspecified: Secondary | ICD-10-CM

## 2021-08-07 MED ORDER — PREDNISONE 20 MG PO TABS: ORAL_TABLET | ORAL | 0 refills | Status: DC

## 2021-08-07 MED ORDER — AZITHROMYCIN 250 MG PO TABS: ORAL_TABLET | ORAL | 0 refills | Status: AC

## 2021-08-07 NOTE — Progress Notes (Signed)
? ?Subjective:  ? ? ? Patient ID: [name] [name], female    DOB: 08/28/1960, 61 y.o.   MRN: [id number] ? ?Chief Complaint  ?Patient presents with  ? Cough  ?  Pt c/o coughing and Green mucus has been coming up. Pt would like a  Z pack. Present since Sunday.   ? Nasal Congestion  ? ?HPI: ?Bronchitis: reports mostly productive cough that started 5 days ago, denies fever, scratchy throat and nasal congestion. reports having exact sx back in Feb and required 2 rounds of abt. reports coughing that wakes her up during the night. Denies using rescue inhaler. Taking OTC oral antihistamine only qd. Denies smoking. 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  ? ? ?Health Maintenance Due  ?Topic Date Due  ? COVID-19 Vaccine (4 - Booster for Pfizer series) 06/16/2020  ? ? ?Past Medical History:  ?Diagnosis Date  ? Atrial flutter with rapid ventricular response (HCC) 09/08/2011  ? Detached retina   ? High cholesterol   ? Hyperlipemia 09/08/2011  ? ? ?Past Surgical History:  ?Procedure Laterality Date  ? APPENDECTOMY    ? ATRIAL FLUTTER ABLATION N/A 12/15/2011  ? Procedure: ATRIAL FLUTTER ABLATION;  Surgeon: Marinus Maw, MD;  Location: [facility] CATH LAB;  Service: Cardiovascular;  Laterality: N/A;  ? CARDIOVERSION  12/04/2011  ? Procedure: CARDIOVERSION;  Surgeon: Thurmon Fair, MD;  Location: [facility] ENDOSCOPY;  Service: Cardiovascular;  Laterality: N/A;  ? TEE WITHOUT CARDIOVERSION  09/09/2011  ? Procedure: TRANSESOPHAGEAL ECHOCARDIOGRAM (TEE);  Surgeon: Thurmon Fair, MD;  Location: [facility] ENDOSCOPY;  Service: Cardiovascular;  Laterality: N/A;  anes. not able to come fo cardioversion / pt TBA but in ER  ? TEE WITHOUT CARDIOVERSION  12/04/2011  ? Procedure: TRANSESOPHAGEAL ECHOCARDIOGRAM (TEE);  Surgeon: Thurmon Fair, MD;  Location: [facility] ENDOSCOPY;  Service: Cardiovascular;  Laterality: N/A;  ? TONSILLECTOMY    ? TUBAL LIGATION    ? ? ?Outpatient Medications Prior to Visit  ?Medication Sig Dispense Refill  ? albuterol (VENTOLIN HFA) 108 (90 Base) MCG/ACT  inhaler Inhale 2 puffs into the lungs every 6 (six) hours as needed for wheezing or shortness of breath. 8 g 2  ? Ascorbic Acid (VITAMIN C) 1000 MG tablet Take 1,000 mg by mouth daily.    ? aspirin 81 MG EC tablet Take 81 mg by mouth daily.    ? cholecalciferol (VITAMIN D) 1000 units tablet Take 1,000 Units by mouth daily.    ? ciprofloxacin (CILOXAN) 0.3 % ophthalmic solution Place 2 drops into both eyes every 4 (four) hours while awake. Administer 1 drop, every 2 hours, while awake, for 2 days. Then 1 drop, every 4 hours, while awake, for the next 5 days. 5 mL 0  ? Cyanocobalamin (VITAMIN B-12 PO) Take by mouth.    ? diazepam (VALIUM) 5 MG tablet Take 2.5-5 mg by mouth daily.    ? ezetimibe (ZETIA) 10 MG tablet TAKE 1 TABLET(10 MG) BY MOUTH AT BEDTIME 90 tablet 3  ? fluorouracil (EFUDEX) 5 % cream Apply topically 2 (two) times daily. For 2-4 weeks 40 g 0  ? Multiple Vitamin (MULTIVITAMIN) tablet Take 1 tablet by mouth daily.    ? Omega-3 Fatty Acids (FISH OIL) 1000 MG CAPS Take by mouth.    ? rosuvastatin (CRESTOR) 5 MG tablet TAKE 1 TABLET(5 MG) BY MOUTH AT BEDTIME 90 tablet 3  ? venlafaxine XR (EFFEXOR-XR) 37.5 MG 24 hr capsule Take 112.5 mg by mouth every morning.    ? amoxicillin-clavulanate (AUGMENTIN) 875-125 MG tablet Take  1 tablet by mouth 2 (two) times daily. 20 tablet 0  ? azithromycin (ZITHROMAX) 250 MG tablet Take 2 tabs today, then 1 tab daily for 4 days 1 each 0  ? predniSONE (DELTASONE) 10 MG tablet Take 4 tabs qd x 2 days, 3 qd x 2 days, 2 qd x 2d, 1qd x 3 days 21 tablet 0  ? ?No facility-administered medications prior to visit.  ? ? ?Allergies  ?Allergen Reactions  ? Wellbutrin [Bupropion] Anaphylaxis  ? Aripiprazole Swelling  ? Statins Other (See Comments)  ?  Myalgias with Crestor and Lipitor 8 yrs ago; MUSCLE ACHES  ? ? ? ?   ?Objective:  ?  ?Physical Exam ?Vitals and nursing note reviewed.  ?Constitutional:   ?   Appearance: Normal appearance.  ?HENT:  ?   Right Ear: Tympanic membrane and ear  canal normal.  ?   Left Ear: Tympanic membrane and ear canal normal.  ?   Nose:  ?   Right Sinus: Frontal sinus tenderness present.  ?   Left Sinus: No frontal sinus tenderness.  ?   Mouth/Throat:  ?   Mouth: Mucous membranes are moist.  ?   Pharynx: Oropharyngeal exudate and posterior oropharyngeal erythema present. No pharyngeal swelling or uvula swelling.  ?Cardiovascular:  ?   Rate and Rhythm: Normal rate and regular rhythm.  ?Pulmonary:  ?   Effort: Pulmonary effort is normal.  ?   Breath sounds: Normal breath sounds.  ?Musculoskeletal:     ?   General: Normal range of motion.  ?Skin: ?   General: Skin is warm and dry.  ?Neurological:  ?   Mental Status: She is alert.  ?Psychiatric:     ?   Mood and Affect: Mood normal.     ?   Behavior: Behavior normal.  ? ? ?BP 110/78 (BP Location: Left Arm, Patient Position: Sitting, Cuff Size: Large)   Pulse 83   Temp 98.4 ?F (36.9 ?C) (Temporal)   Ht 5\' 7"  (1.702 m)   Wt 175 lb (79.4 kg)   SpO2 95%   BMI 27.41 kg/m?  ?Wt Readings from Last 3 Encounters:  ?08/07/21 175 lb (79.4 kg)  ?06/17/21 178 lb 4 oz (80.9 kg)  ?06/06/21 174 lb 6 oz (79.1 kg)  ? ? ?   ?Assessment & Plan:  ? ?Problem List Items Addressed This Visit   ?None ?Visit Diagnoses   ? ? Subacute bronchitis    -  Primary  ? Same sx 2 mos ago, pt not using albuterol inhaler, may require steroid inhaler if sx continue to reoccur. aDvised using inhaler tid, last use qhs, increase water intake, sending prednisone low dose, 5d regimen, advised starting this before Zpack as may not need abt. ? ?Relevant Medications  ? azithromycin (ZITHROMAX) 250 MG tablet  ? predniSONE (DELTASONE) 20 MG tablet  ? ?  ? ? ?Meds ordered this encounter  ?Medications  ? azithromycin (ZITHROMAX) 250 MG tablet  ?  Sig: Take 2 tablets on day 1, then 1 tablet daily on days 2 through 5  ?  Dispense:  6 tablet  ?  Refill:  0  ?  Order Specific Question:   Supervising Provider  ?  Answer:   ANDY, CAMILLE L [[id number]]  ? predniSONE (DELTASONE)  20 MG tablet  ?  Sig: Take 2 pills in the morning with breakfast for 3 days, then 1 pill for 2 days  ?  Dispense:  8 tablet  ?  Refill:  0  ?  Order Specific Question:   Supervising Provider  ?  Answer:   ANDY, CAMILLE L [[id number]]  ? ? ?Dulce Sellar, NP ? ?

## 2021-12-24 ENCOUNTER — Encounter: Payer: Self-pay | Admitting: Family Medicine

## 2021-12-24 ENCOUNTER — Other Ambulatory Visit: Payer: Self-pay | Admitting: Family Medicine

## 2021-12-24 ENCOUNTER — Ambulatory Visit (INDEPENDENT_AMBULATORY_CARE_PROVIDER_SITE_OTHER): Payer: BC Managed Care – PPO | Admitting: Family Medicine

## 2021-12-24 VITALS — BP 118/64 | HR 78 | Temp 97.6°F | Ht 67.0 in | Wt 178.4 lb

## 2021-12-24 DIAGNOSIS — Z0000 Encounter for general adult medical examination without abnormal findings: Secondary | ICD-10-CM | POA: Diagnosis not present

## 2021-12-24 DIAGNOSIS — E7801 Familial hypercholesterolemia: Secondary | ICD-10-CM | POA: Diagnosis not present

## 2021-12-24 DIAGNOSIS — M19041 Primary osteoarthritis, right hand: Secondary | ICD-10-CM | POA: Insufficient documentation

## 2021-12-24 DIAGNOSIS — Z1212 Encounter for screening for malignant neoplasm of rectum: Secondary | ICD-10-CM

## 2021-12-24 DIAGNOSIS — Z1211 Encounter for screening for malignant neoplasm of colon: Secondary | ICD-10-CM

## 2021-12-24 DIAGNOSIS — M19042 Primary osteoarthritis, left hand: Secondary | ICD-10-CM

## 2021-12-24 DIAGNOSIS — Z8249 Family history of ischemic heart disease and other diseases of the circulatory system: Secondary | ICD-10-CM

## 2021-12-24 DIAGNOSIS — F339 Major depressive disorder, recurrent, unspecified: Secondary | ICD-10-CM | POA: Diagnosis not present

## 2021-12-24 DIAGNOSIS — Z8679 Personal history of other diseases of the circulatory system: Secondary | ICD-10-CM

## 2021-12-24 DIAGNOSIS — F988 Other specified behavioral and emotional disorders with onset usually occurring in childhood and adolescence: Secondary | ICD-10-CM | POA: Insufficient documentation

## 2021-12-24 MED ORDER — DICLOFENAC SODIUM 1 % EX GEL: 2.0000 g | Freq: Four times a day (QID) | CUTANEOUS | 5 refills | Status: AC | PRN

## 2021-12-24 MED ORDER — LISDEXAMFETAMINE DIMESYLATE 10 MG PO CAPS: 10.0000 mg | ORAL_CAPSULE | Freq: Every day | ORAL | 0 refills | Status: DC

## 2021-12-24 MED ORDER — ROSUVASTATIN CALCIUM 5 MG PO TABS: 5.0000 mg | ORAL_TABLET | Freq: Every day | ORAL | 3 refills | Status: DC

## 2021-12-24 MED ORDER — EZETIMIBE 10 MG PO TABS
ORAL_TABLET | ORAL | 3 refills | Status: DC
Start: 2021-12-24 — End: 2023-02-19

## 2021-12-24 NOTE — Progress Notes (Signed)
Subjective  Chief Complaint  Patient presents with   Annual Exam    Pt here for annual exam and is not currently fasting     HPI: [name] [name] is a 61 y.o. female who presents to [facility] [place] today for a Female Wellness Visit. She also has the concerns and/or needs as listed above in the chief complaint. These will be addressed in addition to the Health Maintenance Visit.   Wellness Visit: annual visit with health maintenance review and exam without Pap  HM: due colorectal cancer screen: prefers cologuard over colonoscopy. Had normal at age 54. Mammo and pap are up to date. Has lost weight. Eating better. Stressed about work. Some marital issues.  Chronic disease f/u and/or acute problem visit: (deemed necessary to be done in addition to the wellness visit): Mood: fairly well controlled although above stated stressors are active.  ADD: lifelong. Had been treated with adderall about 20 years ago. However, caused symptomatic palpitations so stopped. 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 Pt would like to restart treatment if able. She has h/o atrial flutter s/p ablation about 10 years ago. No recurrence of palpitations. Had cards eval for atypical cp in 2020 but that was thought to be related anxiety. She is not a heavy caffeine user.  HLD: on zetia 10 and crestor 5 - reports arthralgias on higher dose. Reviewed recent labs from transplant eval (renal) and ldl was up to 111 from 80 a year ago. Nl lfts.  C/o hand joint pain. Uses advil intermittently which helps. No weakness   Assessment  1. Annual physical exam   2. Chronic recurrent major depressive disorder (HCC)   3. Familial hypercholesteremia   4. Family history of premature CAD   5. Encounter for colorectal cancer screening   6. Attention deficit disorder (ADD) without hyperactivity   7. History of atrial flutter       Plan  Female Wellness Visit: Age appropriate Health Maintenance and Prevention measures were discussed with patient. Included topics are cancer screening recommendations, ways to keep healthy (see AVS) including dietary and exercise recommendations, regular eye and dental care, use of seat belts, and avoidance of moderate alcohol use and tobacco use. Cologuard ordered.  BMI: discussed patient's BMI and encouraged positive lifestyle modifications to help get to or maintain a target BMI. HM needs and immunizations were addressed and ordered. See below for orders. See HM and immunization section for updates. Routine labs and screening tests ordered including cmp, cbc and lipids where appropriate. Discussed recommendations regarding Vit D and calcium supplementation (see AVS)  Chronic disease management visit and/or acute problem visit: Mood/anxiety: continue effexor and therapy. ADD: counseling done. Will try another stimulant to get sxs under control while monitoring for recurrent palpitations. Vyvanse 10 ordered.  HLD: will try to increase dose of crestor 10 to see if she can tolerate it. Eat better as well.  OA hands: voltaren gel  Follow up: 4 weeks for f/u ADD  Orders Placed This Encounter  Procedures   Cologuard   Meds ordered this encounter  Medications   ezetimibe (ZETIA) 10 MG tablet    Sig: TAKE 1 TABLET(10 MG) BY MOUTH AT BEDTIME    Dispense:  90 tablet    Refill:  3   rosuvastatin (CRESTOR) 5 MG tablet    Sig: Take 1 tablet (5 mg total) by mouth at bedtime.  Dispense:  90 tablet    Refill:  3   lisdexamfetamine (VYVANSE) 10 MG capsule    Sig: Take 1 capsule (10 mg total) by mouth daily.    Dispense:  30 capsule    Refill:  0   diclofenac Sodium (VOLTAREN) 1 % GEL    Sig: Apply 2 g topically 4 (four) times daily as needed (hand pain).    Dispense:  150 g    Refill:  5      Body mass index is 27.94 kg/m. Wt Readings from Last 3 Encounters:  12/24/21 178 lb  6.4 oz (80.9 kg)  08/07/21 175 lb (79.4 kg)  06/17/21 178 lb 4 oz (80.9 kg)     Patient Active Problem List   Diagnosis Date Noted   Tardive dyskinesia 06/03/2018    Priority: High   Familial hypercholesteremia 07/26/2015    Priority: High   Family history of premature CAD 07/26/2012    Priority: High    Brother and father    Chronic recurrent major depressive disorder (HCC) 09/09/2011    Priority: High    [name], FNP psych; failed long term effexor, abilify and seroquel    Postherpetic neuralgia 06/06/2019    Priority: Medium    Retinal detachment 12/31/2011    Priority: Medium     Overview:  Dr Stephannie Li, retinal specialist    History of atrial flutter 09/29/2011    Priority: Medium     Overview:  Atrial flutter - s/p cardioversion x 2, s/p ablation 12/2011 [facility], Dr. Allyson Sabal    Atrophic vaginitis 06/03/2018    Priority: Low   AR (allergic rhinitis) 08/01/2010    Priority: Low   Attention deficit disorder (ADD) without hyperactivity 12/24/2021   Health Maintenance  Topic Date Due   INFLUENZA VACCINE  12/02/2021   COVID-19 Vaccine (4 - Pfizer risk series) 01/09/2022 (Originally 06/16/2020)   COLONOSCOPY (Pts 45-62yrs Insurance coverage will need to be confirmed)  03/22/2022   MAMMOGRAM  07/04/2022   PAP SMEAR-Modifier  06/04/2023   TETANUS/TDAP  08/06/2023   Hepatitis C Screening  Completed   HIV Screening  Completed   Zoster Vaccines- Shingrix  Completed   HPV VACCINES  Aged Out   Immunization History  Administered Date(s) Administered   Influenza,inj,Quad PF,6+ Mos 04/30/2017, 03/04/2018, 01/30/2019   PFIZER(Purple Top)SARS-COV-2 Vaccination 06/30/2019, 07/21/2019, 04/21/2020   Tdap 10/07/2010, 08/05/2013   Zoster Recombinat (Shingrix) 10/10/2020, 12/10/2020   We updated and reviewed the patient's past history in detail and it is documented below. Allergies: Patient is allergic to wellbutrin [bupropion], aripiprazole, and  statins. Past Medical History Patient  has a past medical history of Atrial flutter with rapid ventricular response (HCC) (09/08/2011), Detached retina, High cholesterol, and Hyperlipemia (09/08/2011). Past Surgical History Patient  has a past surgical history that includes Appendectomy; Tonsillectomy; Tubal ligation; TEE without cardioversion (09/09/2011); TEE without cardioversion (12/04/2011); Cardioversion (12/04/2011); and Atrial flutter ablation (N/A, 12/15/2011). Family History: Patient family history includes Coronary artery disease in her brother and father; Hypertension in her mother; Stroke in her mother. Social History:  Patient  reports that she quit smoking about 20 years ago. Her smoking use included cigarettes. She has never used smokeless tobacco. She reports current alcohol use of about 2.0 standard drinks of alcohol per week. She reports that she does not use drugs.  Review of Systems: Constitutional: negative for fever or malaise Ophthalmic: negative for photophobia, double vision or loss of vision Cardiovascular: negative for chest pain, dyspnea on exertion,  or new LE swelling Respiratory: negative for SOB or persistent cough Gastrointestinal: negative for abdominal pain, change in bowel habits or melena Genitourinary: negative for dysuria or gross hematuria, no abnormal uterine bleeding or disharge Musculoskeletal: negative for new gait disturbance or muscular weakness Integumentary: negative for new or persistent rashes, no breast lumps Neurological: negative for TIA or stroke symptoms Psychiatric: negative for SI or delusions Allergic/Immunologic: negative for hives  Patient Care Team    Relationship Specialty Notifications Start End  Leamon Arnt, MD PCP - General Family Medicine  04/30/17   Buford Dresser, MD Consulting Physician Cardiology  06/06/19   [place], [place] Physician Psychiatry  06/06/19     Objective  Vitals: BP 118/64   Pulse 78    Temp 97.6 F (36.4 C)   Ht 5\' 7"  (1.702 m)   Wt 178 lb 6.4 oz (80.9 kg)   SpO2 95%   BMI 27.94 kg/m  General:  Well developed, well nourished, no acute distress  Psych:  Alert and orientedx3,normal mood and affect HEENT:  Normocephalic, atraumatic, non-icteric sclera,  supple neck without adenopathy, mass or thyromegaly Cardiovascular:  Normal S1, S2, RRR without gallop, rub or murmur Respiratory:  Good breath sounds bilaterally, CTAB with normal respiratory effort Gastrointestinal: normal bowel sounds, soft, non-tender, no noted masses. No HSM MSK: hand oa changes present bilaterally, Joints are without erythema or swelling.  Skin:  Warm, no rashes or suspicious lesions noted Neurologic:    Mental status is normal. CN 2-11 are normal. Gross motor and sensory exams are normal. Normal gait. No tremor   Commons side effects, risks, benefits, and alternatives for medications and treatment plan prescribed today were discussed, and the patient expressed understanding of the given instructions. Patient is instructed to call or message via MyChart if he/she has any questions or concerns regarding our treatment plan. No barriers to understanding were identified. We discussed Red Flag symptoms and signs in detail. Patient expressed understanding regarding what to do in case of urgent or emergency type symptoms.  Medication list was reconciled, printed and provided to the patient in AVS. Patient instructions and summary information was reviewed with the patient as documented in the AVS. This note was prepared with assistance of Dragon voice recognition software. Occasional wrong-word or sound-a-like substitutions may have occurred due to the inherent limitations of voice recognition software  This visit occurred during the SARS-CoV-2 public health emergency.  Safety protocols were in place, including screening questions prior to the visit, additional usage of staff PPE, and extensive cleaning of exam  room while observing appropriate contact time as indicated for disinfecting solutions.

## 2021-12-24 NOTE — Patient Instructions (Signed)
Please return in 4 weeks to recheck ADD on medications and cryotherapy of skin lesions.  If you have any questions or concerns, please don't hesitate to send me a message via MyChart or call the office at [phone]. Thank you for visiting with [place] today! It's our pleasure caring for you.

## 2022-01-08 ENCOUNTER — Encounter: Payer: Self-pay | Admitting: Family Medicine

## 2022-01-26 ENCOUNTER — Ambulatory Visit: Payer: BC Managed Care – PPO | Admitting: Family Medicine

## 2022-01-26 ENCOUNTER — Encounter: Payer: Self-pay | Admitting: Family Medicine

## 2022-01-26 ENCOUNTER — Encounter: Payer: Self-pay | Admitting: *Deleted

## 2022-01-26 VITALS — BP 100/70 | HR 86 | Temp 98.4°F | Ht 67.0 in | Wt 179.0 lb

## 2022-01-26 DIAGNOSIS — Z23 Encounter for immunization: Secondary | ICD-10-CM

## 2022-01-26 DIAGNOSIS — L814 Other melanin hyperpigmentation: Secondary | ICD-10-CM

## 2022-01-26 DIAGNOSIS — F988 Other specified behavioral and emotional disorders with onset usually occurring in childhood and adolescence: Secondary | ICD-10-CM | POA: Diagnosis not present

## 2022-01-26 DIAGNOSIS — L570 Actinic keratosis: Secondary | ICD-10-CM | POA: Diagnosis not present

## 2022-01-26 MED ORDER — METHYLPHENIDATE HCL 10 MG PO TABS: 10.0000 mg | ORAL_TABLET | Freq: Every day | ORAL | 0 refills | Status: DC

## 2022-01-26 MED ORDER — METHYLPHENIDATE HCL 10 MG PO TABS
10.0000 mg | ORAL_TABLET | Freq: Every day | ORAL | 0 refills | Status: DC
Start: 2022-02-25 — End: 2022-09-10

## 2022-01-26 NOTE — Progress Notes (Signed)
Subjective  CC:  Chief Complaint  Patient presents with   skin lesions   ADHD    HPI: [name] [name] is a 61 y.o. female who presents to the office today to address the problems listed above in the chief complaint. ADD: Was to start Vyvanse but has not been able to get the medication due to insurance problems with the prior authorization.  She has been on Adderall in the past.  Would like to try a stimulant as we discussed.  See last note.  Continues to struggle through daytime work responsibilities due to difficulty multitasking. Complains of skin lesions that flake and persist.  Has 2 lesions on her left forearm.  Has tried steroid creams without resolution of symptoms.  No pain or blistering. Age related skin changes on face and arms  Assessment  1. Attention deficit disorder (ADD) without hyperactivity   2. Need for immunization against influenza   3. Actinic keratosis   4. Solar lentigo      Plan  ADD: Changed to Ritalin 10 mg daily.  Trial.  Risks and benefits discussed.  Recheck 3 months. Actinic keratosis: Premalignant.  Cryotherapy x2 each lesion.  Tolerated well.  Routine postprocedure care discussed Solar lentigo's: Like cryotherapy to right cheek and left leg.  Follow up: Return in about 3 months (around 04/27/2022) for follow up on ADD.  Visit date not found  Orders Placed This Encounter  Procedures   Flu Vaccine QUAD 20mo+IM (Fluarix, Fluzone & Alfiuria Quad PF)   Meds ordered this encounter  Medications   methylphenidate (RITALIN) 10 MG tablet    Sig: Take 1 tablet (10 mg total) by mouth daily.    Dispense:  30 tablet    Refill:  0   methylphenidate (RITALIN) 10 MG tablet    Sig: Take 1 tablet (10 mg total) by mouth daily.    Dispense:  30 tablet    Refill:  0   methylphenidate (RITALIN) 10 MG tablet    Sig: Take 1 tablet (10 mg total) by mouth daily.    Dispense:  30 tablet    Refill:  0      I reviewed the patients updated PMH, FH, and SocHx.     Patient Active Problem List   Diagnosis Date Noted   Tardive dyskinesia 06/03/2018    Priority: High   Familial hypercholesteremia 07/26/2015    Priority: High   Family history of premature CAD 07/26/2012    Priority: High   Chronic recurrent major depressive disorder ([place]) 09/09/2011    Priority: High   Postherpetic neuralgia 06/06/2019    Priority: Medium    Retinal detachment 12/31/2011    Priority: Medium    History of atrial flutter 09/29/2011    Priority: Medium    Atrophic vaginitis 06/03/2018    Priority: Low   AR (allergic rhinitis) 08/01/2010    Priority: Low   Attention deficit disorder (ADD) without hyperactivity 12/24/2021   Primary osteoarthritis of both hands 12/24/2021   Current Meds  Medication Sig   albuterol (VENTOLIN HFA) 108 (90 Base) MCG/ACT inhaler Inhale 2 puffs into the lungs every 6 (six) hours as needed for wheezing or shortness of breath.   Ascorbic Acid (VITAMIN C) 1000 MG tablet Take 1,000 mg by mouth daily.   aspirin 81 MG EC tablet Take 81 mg by mouth daily.   cholecalciferol (VITAMIN D) 1000 units tablet Take 1,000 Units by mouth daily.   ciprofloxacin (CILOXAN) 0.3 % ophthalmic solution Place 2 drops  into both eyes every 4 (four) hours while awake. Administer 1 drop, every 2 hours, while awake, for 2 days. Then 1 drop, every 4 hours, while awake, for the next 5 days.   Cyanocobalamin (VITAMIN B-12 PO) Take by mouth.   diazepam (VALIUM) 5 MG tablet Take 2.5-5 mg by mouth daily.   diclofenac Sodium (VOLTAREN) 1 % GEL Apply 2 g topically 4 (four) times daily as needed (hand pain).   ezetimibe (ZETIA) 10 MG tablet TAKE 1 TABLET(10 MG) BY MOUTH AT BEDTIME   methylphenidate (RITALIN) 10 MG tablet Take 1 tablet (10 mg total) by mouth daily.   [START ON 02/25/2022] methylphenidate (RITALIN) 10 MG tablet Take 1 tablet (10 mg total) by mouth daily.   [START ON 03/27/2022] methylphenidate (RITALIN) 10 MG tablet Take 1 tablet (10 mg total) by mouth daily.    Multiple Vitamin (MULTIVITAMIN) tablet Take 1 tablet by mouth daily.   Omega-3 Fatty Acids (FISH OIL) 1000 MG CAPS Take by mouth.   rosuvastatin (CRESTOR) 5 MG tablet Take 1 tablet (5 mg total) by mouth at bedtime.   venlafaxine XR (EFFEXOR-XR) 37.5 MG 24 hr capsule Take 112.5 mg by mouth every morning.    Allergies: Patient is allergic to wellbutrin [bupropion], aripiprazole, and statins. Family History: Patient family history includes Coronary artery disease in her brother and father; Hypertension in her mother; Stroke in her mother. Social History:  Patient  reports that she quit smoking about 20 years ago. Her smoking use included cigarettes. She has never used smokeless tobacco. She reports current alcohol use of about 2.0 standard drinks of alcohol per week. She reports that she does not use drugs.  Review of Systems: Constitutional: Negative for fever malaise or anorexia Cardiovascular: negative for chest pain Respiratory: negative for SOB or persistent cough Gastrointestinal: negative for abdominal pain  Objective  Vitals: BP 100/70   Pulse 86   Temp 98.4 F (36.9 C)   Ht 5\' 7"  (1.702 m)   Wt 179 lb (81.2 kg)   SpO2 96%   BMI 28.04 kg/m  General: no acute distress , A&Ox3  Skin:  Warm, no rashes Left forearm: Two 3 to 5 mm erythematous flaky lesions consistent with AK's Solar lentigos: Face or arms and legs.  Cryotherapy Procedure Note  Pre-operative Diagnosis: Actinic keratosis, solar lentigos  Post-operative Diagnosis: same  Locations: forearms bilaterally, left cheek  Indications: premalignant  Anesthesia: none  Procedure Details   Patient informed of risks (permanent scarring, infection, light or dark discoloration, bleeding, infection, weakness, numbness and recurrence of the lesion) and benefits of the procedure and verbal informed consent obtained. Universal time out performed  The areas are treated with liquid nitrogen therapy, frozen until ice  ball extended 2 mm beyond lesion, allowed to thaw, and treated again. The patient tolerated procedure well.  The patient was instructed on post-op care, warned that there may be blister formation, redness and pain. Recommend OTC analgesia as needed for pain.  Condition: Stable  Complications: none.   Commons side effects, risks, benefits, and alternatives for medications and treatment plan prescribed today were discussed, and the patient expressed understanding of the given instructions. Patient is instructed to call or message via MyChart if he/she has any questions or concerns regarding our treatment plan. No barriers to understanding were identified. We discussed Red Flag symptoms and signs in detail. Patient expressed understanding regarding what to do in case of urgent or emergency type symptoms.  Medication list was reconciled, printed and provided to  the patient in AVS. Patient instructions and summary information was reviewed with the patient as documented in the AVS. This note was prepared with assistance of Dragon voice recognition software. Occasional wrong-word or sound-a-like substitutions may have occurred due to the inherent limitations of voice recognition software  This visit occurred during the SARS-CoV-2 public health emergency.  Safety protocols were in place, including screening questions prior to the visit, additional usage of staff PPE, and extensive cleaning of exam room while observing appropriate contact time as indicated for disinfecting solutions.

## 2022-01-26 NOTE — Patient Instructions (Signed)
Please return in 3 months f/u ADD  If you have any questions or concerns, please don't hesitate to send me a message via MyChart or call the office at [phone]. Thank you for visiting with [place] today! It's our pleasure caring for you.   Actinic Keratosis An actinic keratosis is a precancerous growth on the skin. If there is more than one, the condition is called actinic keratoses. These growths appear most often on parts of the skin that get a lot of sun exposure, including the: Scalp. Face. Ears. Lips. Upper back. Forearms. Backs of the hands. If left untreated, these growths may develop into a skin cancer called squamous cell carcinoma. It is important to have all these growths checked by a health care provider to determine the best treatment. What are the causes? Actinic keratoses are caused by getting too much ultraviolet (UV) radiation from the sun or other UV light sources. What increases the risk? You are more likely to develop this condition if you: Have light-colored skin or blue eyes. Have blond or red hair. Spend a lot of time in the sun. Do not protect your skin from the sun when outdoors. Are an older person. The risk of developing an actinic keratosis increases with age. What are the signs or symptoms? These growths feel like scaly, rough spots of skin. Symptoms of this condition include growths that may: Be as small as a pinhead or as big as a quarter. Itch, hurt, or feel sensitive. Be skin-colored, light tan, dark tan, pink, or a combination of these colors. In most cases, the growths become red. Have a small piece of pink or gray skin (skin tag) growing from them. It may be easier to notice the growths by feeling them rather than seeing them. Sometimes, actinic keratoses disappear but may return a few days to a few weeks later. How is this diagnosed? This condition is usually diagnosed with a physical exam. A tissue sample may be removed from the growth and examined  under a microscope (biopsy). How is this treated? This condition may be treated by: Scraping off the actinic keratosis (curettage). Freezing the actinic keratosis with liquid nitrogen (cryosurgery). This causes the growth to eventually fall off. Applying medicated creams or gels to destroy the cells in the growth. Applying chemicals to the growth to make the outer layers of skin peel off (chemical peel). Using photodynamic therapy. In this procedure, medicated cream is applied to the actinic keratosis. This cream increases your skin's sensitivity to light. Then, a strong light is aimed at the actinic keratosis to destroy cells in the growth. Follow these instructions at home: Skin care Apply cool, wet cloths (coolcompresses) to the affected areas. Do not scratch your skin. Check your skin regularly for any growths, especially ones that: Start to itch or bleed. Change in size, shape, or color. Caring for the treated area Keep the treated area clean and dry as told by your health care provider. Do not apply any medicine, cream, or lotion to the treated area unless your health care provider tells you to do that. Do not pick at blisters or try to break them open. This can cause infection and scarring. If you have red or irritated skin after treatment, follow instructions from your health care provider about how to take care of the treated area. Make sure you: Wash your hands with soap and water for at least 20 seconds before and after you change your bandage (dressing). If soap and water are not available,  use hand sanitizer. Change your dressing as told by your health care provider. If you have red or irritated skin after treatment, check the treated area every day for signs of infection. Check for: Redness, swelling, or pain. Fluid or blood. Warmth. Pus or a bad smell. Lifestyle Do not use any products that contain nicotine or tobacco. These products include cigarettes, chewing tobacco, and  vaping devices, such as e-cigarettes. If you need help quitting, ask your health care provider. Take steps to protect your skin from the sun, such as: Avoiding the sun between 10:00 a.m. and 4:00 p.m. This is when the UV light is the strongest. Using a sunscreen or sunblock with SPF 30 or greater. Applying sunscreen before you are exposed to sunlight and reapplying as often as told by the instructions on the sunscreen container. Wearing protective gear, including: Sunglasses with UV protection. A hat and clothing that protect your skin from sunlight. Avoiding medicines that increase your sensitivity to sunlight when possible. Avoidingtanning beds and other indoor tanning devices. General instructions Take or apply over-the-counter and prescription medicines only as told by your health care provider. Return to your normal activities as told by your health care provider. Ask your health care provider what activities are safe for you. Have a skin exam done every year by a health care provider who is a skin specialist (dermatologist). Keep all follow-up visits. Your health care provider will want to check that the site has healed after treatment. Contact a health care provider if: You notice any changes or new growths on your skin. You have swelling, pain, or redness around your treated area. You have fluid or blood coming from your treated area. Your treated area feels warm to the touch. You have pus or a bad smell coming from your treated area. You have a fever or chills. You have a blister that becomes large and painful. Summary An actinic keratosis is a precancerous growth on the skin.If left untreated, these growths can develop into skin cancer. Check your skin regularly for any growths, especially growths that start to itch or bleed, or change in size, shape, or color. Take steps to protect your skin from the sun. Contact a health care provider if you notice any changes or new growths on  your skin. This information is not intended to replace advice given to you by your health care provider. Make sure you discuss any questions you have with your health care provider. Document Revised: 07/03/2021 Document Reviewed: 07/03/2021 Elsevier Patient Education  2023 [employer].

## 2022-02-06 LAB — COLOGUARD: COLOGUARD: NEGATIVE

## 2022-04-24 ENCOUNTER — Other Ambulatory Visit: Payer: Self-pay | Admitting: Family Medicine

## 2022-04-24 MED ORDER — METHYLPHENIDATE HCL 10 MG PO TABS
10.0000 mg | ORAL_TABLET | Freq: Every day | ORAL | 0 refills | Status: DC
Start: 2022-04-24 — End: 2022-09-10

## 2022-07-07 ENCOUNTER — Other Ambulatory Visit: Payer: Self-pay | Admitting: Orthopedic Surgery

## 2022-07-07 DIAGNOSIS — M25562 Pain in left knee: Secondary | ICD-10-CM

## 2022-07-09 LAB — HM MAMMOGRAPHY

## 2022-07-10 ENCOUNTER — Ambulatory Visit
Admission: RE | Admit: 2022-07-10 | Discharge: 2022-07-10 | Disposition: A | Discharge status: Home / Self Care | Payer: BC Managed Care – PPO | Source: Ambulatory Visit | Attending: Orthopedic Surgery | Admitting: Orthopedic Surgery

## 2022-07-10 DIAGNOSIS — M25562 Pain in left knee: Secondary | ICD-10-CM

## 2022-07-13 ENCOUNTER — Encounter: Payer: Self-pay | Admitting: Family Medicine

## 2022-07-16 ENCOUNTER — Encounter: Payer: Self-pay | Admitting: Podiatry

## 2022-07-16 ENCOUNTER — Ambulatory Visit: Payer: BC Managed Care – PPO | Admitting: Podiatry

## 2022-07-16 DIAGNOSIS — M2041 Other hammer toe(s) (acquired), right foot: Secondary | ICD-10-CM

## 2022-07-16 DIAGNOSIS — B351 Tinea unguium: Secondary | ICD-10-CM | POA: Diagnosis not present

## 2022-07-16 DIAGNOSIS — M79671 Pain in right foot: Secondary | ICD-10-CM

## 2022-07-16 DIAGNOSIS — I7300 Raynaud's syndrome without gangrene: Secondary | ICD-10-CM

## 2022-07-16 DIAGNOSIS — M2010 Hallux valgus (acquired), unspecified foot: Secondary | ICD-10-CM | POA: Diagnosis not present

## 2022-07-16 DIAGNOSIS — L84 Corns and callosities: Secondary | ICD-10-CM

## 2022-07-16 DIAGNOSIS — M79672 Pain in left foot: Secondary | ICD-10-CM | POA: Diagnosis not present

## 2022-07-16 DIAGNOSIS — M2042 Other hammer toe(s) (acquired), left foot: Secondary | ICD-10-CM

## 2022-07-16 MED ORDER — EFINACONAZOLE 10 % EX SOLN: 1.0000 [drp] | Freq: Every day | CUTANEOUS | 11 refills | Status: DC

## 2022-07-16 NOTE — Progress Notes (Signed)
  Subjective:  Patient ID: [name] [name], female    DOB: 09-21-1960,  MRN: [id number]  Chief Complaint  Patient presents with   Hammer Toe    np hammertoe pain bil/ toes turning inward - several small calluses or corns    62 y.o. female presents with the above complaint. History confirmed with patient.  She has multiple issues to discuss, she is noting discoloration in the left hallux nail that is begun after pedicures, she usually gets these to get out the ingrown corners as well as take care of corns and calluses.  She has not been since she noted the discoloration starting and seems to be growing out now.  Her toes are deviating over and rubbing together causing corns on the inside of the right second toe and on the inside of the big toe.  She also has a spot on the bottom of the left great toe which seem to be bruised  Objective:  Physical Exam: Toes lightly cyanotic, cool to touch and blue, improves with warming, no trophic changes or ulcerative lesions, normal DP and PT pulses, normal sensory exam, and bilateral she has mild hallux valgus deformity with small bunion present medially, hallux interphalangeus noted as well, great toe abuts the second toe especially on the right side which causes an interdigital corn on the PIPJ, she has medial pinch callus on the hallux, dystrophy and yellow discoloration on the left hallux nail with proximal clearing noted.  Plantar left hallux pulp healing blister     Assessment:   1. Onychomycosis   2. Callus of foot   3. Acquired hallux interphalangeus, unspecified laterality   4. Hammertoe of right foot   5. Hammertoe of left foot   6. Raynaud's disease without gangrene   7. Pain in both feet [M79.671, M79.672]      Plan:  Patient was evaluated and treated and all questions answered.  We reviewed the treatment options of onychomycosis, her fungal disease is a limited to a single nail appears to be relatively superficial and hopefully should  improve with topical therapy.  Do not think oral treatment is necessary at this point.  I recommended utilizing Jublia and Rx was sent to specialty pharmacy we reviewed its use.  All symptomatic hyperkeratoses were safely debrided with a sterile #15 blade to patient's level of comfort without incident. We discussed preventative and palliative care of these lesions including supportive and accommodative shoegear, padding, prefabricated and custom molded accommodative orthoses, use of a pumice stone and lotions/creams daily.  Silicone corn pads dispensed to offload lesions  We discussed etiology and treatment options of Raynaud's phenomenon, including etiology and treatment options, I recommended monitoring the lesion on the left great toe it should heal uneventfully as we approach warmer weather and I recommended that she keep the foot warm especially during the colder months to reduce risk of this recurring  Regarding her toe deformities and calluses I recommended nonoperative treatment, we discussed impact of shoes on hammertoe and especially with corn formation with crowding of the toes.  If worsening or begins developing further pain we will take radiographs and discuss surgical treatment options.  Return if symptoms worsen or fail to improve.

## 2022-07-16 NOTE — Patient Instructions (Signed)
Look for urea 40% cream or ointment and apply to the thickened dry skin / calluses. This can be bought over the counter, at a pharmacy or online such as [employer].    More silicone pads can be purchased from:  [email or web address]  

## 2022-09-04 ENCOUNTER — Telehealth (INDEPENDENT_AMBULATORY_CARE_PROVIDER_SITE_OTHER): Payer: BC Managed Care – PPO | Admitting: Family Medicine

## 2022-09-04 VITALS — Ht 67.0 in | Wt 176.0 lb

## 2022-09-04 DIAGNOSIS — J9801 Acute bronchospasm: Secondary | ICD-10-CM | POA: Diagnosis not present

## 2022-09-04 DIAGNOSIS — B9689 Other specified bacterial agents as the cause of diseases classified elsewhere: Secondary | ICD-10-CM | POA: Diagnosis not present

## 2022-09-04 MED ORDER — AZITHROMYCIN 250 MG PO TABS: ORAL_TABLET | ORAL | 0 refills | Status: DC

## 2022-09-04 MED ORDER — GUAIFENESIN-CODEINE 100-10 MG/5ML PO SOLN: 5.0000 mL | Freq: Four times a day (QID) | ORAL | 0 refills | Status: DC | PRN

## 2022-09-04 MED ORDER — PREDNISONE 10 MG PO TABS: ORAL_TABLET | ORAL | 0 refills | Status: DC

## 2022-09-04 NOTE — Progress Notes (Signed)
Virtual Visit via Video Note  Subjective  CC:  Chief Complaint  Patient presents with   Sinus Problem    Pt stated that it started last Thursday and then a cough with green discharge and having a hard time swallowing      I connected with [name] [name] on 09/04/22 at 10:00 AM EDT by a video enabled telemedicine application and verified that I am speaking with the correct person using two identifiers. Location patient: Home Location provider: [facility] [address] Office Persons participating in the virtual visit: [name] [name], [name] Ora, MD Trudie Reed CMA  I discussed the limitations of evaluation and management by telemedicine and the availability of in person appointments. The patient expressed understanding and agreed to proceed. HPI: [name] [name] is a 62 y.o. female who was contacted today to address the problems listed above in the chief complaint. 62 year old female with history of reactive airway disease presents with symptoms of upper respiratory tract infection.  Started about a week ago with fevers, sore throat, nasal congestion, sinus pressure and PND.  Symptoms have progressed to chest congestion and productive cough of green-yellow sputum and tightness in the airway.  She has become short of breath.  No longer having fevers.  No pleuritic chest pain.  No GI symptoms.  She has not checked for COVID but says this feels different.  She does get typically 1 or 2 bouts of bronchitis per year that can get pretty bad.  She is coughing at night.  Assessment  1. Acute bacterial bronchitis   2. Bronchospasm      Plan  Bronchitis: Due to duration and progressive symptoms we will treat with Z-Pak.  Recommend albuterol and prednisone taper for bronchospasm.  Robitussin AC for nighttime cough.  Rest, hydration.  Follow-up if unimproved. I discussed the assessment and treatment plan with the patient. The patient was provided an opportunity to ask  questions and all were answered. The patient agreed with the plan and demonstrated an understanding of the instructions.   The patient was advised to call back or seek an in-person evaluation if the symptoms worsen or if the condition fails to improve as anticipated. Follow up: As needed Visit date not found  Meds ordered this encounter  Medications   predniSONE (DELTASONE) 10 MG tablet    Sig: Take 4 tabs qd x 2 days, 3 qd x 2 days, 2 qd x 2d, 1qd x 3 days    Dispense:  21 tablet    Refill:  0   azithromycin (ZITHROMAX) 250 MG tablet    Sig: Take 2 tabs today, then 1 tab daily for 4 days    Dispense:  1 each    Refill:  0   guaiFENesin-codeine 100-10 MG/5ML syrup    Sig: Take 5 mLs by mouth every 6 (six) hours as needed for cough.    Dispense:  120 mL    Refill:  0      I reviewed the patients updated PMH, FH, and SocHx.    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   Ascorbic Acid (VITAMIN C) 1000 MG tablet Take 1,000 mg by mouth daily.   aspirin 81 MG EC tablet Take 81 mg by mouth daily.   atomoxetine (STRATTERA) 25 MG capsule Take 25 mg by  mouth every morning.   azithromycin (ZITHROMAX) 250 MG tablet Take 2 tabs today, then 1 tab daily for 4 days   celecoxib (CELEBREX) 200 MG capsule Take 200 mg by mouth daily.   cholecalciferol (VITAMIN D) 1000 units tablet Take 1,000 Units by mouth daily.   ciprofloxacin (CILOXAN) 0.3 % ophthalmic solution Place 2 drops into both eyes every 4 (four) hours while awake. Administer 1 drop, every 2 hours, while awake, for 2 days. Then 1 drop, every 4 hours, while awake, for the next 5 days.   Cyanocobalamin (VITAMIN B-12 PO) Take by mouth.   diazepam (VALIUM) 5 MG tablet Take 2.5-5 mg by mouth daily.   diclofenac Sodium (VOLTAREN) 1 % GEL Apply 2 g topically 4 (four) times daily as needed (hand pain).   Efinaconazole 10 % SOLN Apply 1 drop topically daily.   ezetimibe (ZETIA) 10 MG tablet TAKE 1 TABLET(10 MG) BY MOUTH AT BEDTIME   guaiFENesin-codeine 100-10 MG/5ML syrup Take 5 mLs by mouth every 6 (six) hours as needed for cough.   methylphenidate (RITALIN) 10 MG tablet Take 1 tablet (10 mg total) by mouth daily.   methylphenidate (RITALIN) 10 MG tablet Take 1 tablet (10 mg total) by mouth daily.   methylphenidate (RITALIN) 10 MG tablet Take 1 tablet (10 mg total) by mouth daily.   Multiple Vitamin (MULTIVITAMIN) tablet Take 1 tablet by mouth daily.   Omega-3 Fatty Acids (FISH OIL) 1000 MG CAPS Take by mouth.   predniSONE (DELTASONE) 10 MG tablet Take 4 tabs qd x 2 days, 3 qd x 2 days, 2 qd x 2d, 1qd x 3 days   rosuvastatin (CRESTOR) 5 MG tablet Take 1 tablet (5 mg total) by mouth at bedtime.   traZODone (DESYREL) 50 MG tablet Take by mouth.   Varenicline Tartrate (TYRVAYA) 0.03 MG/ACT SOLN Place into the nose.   venlafaxine XR (EFFEXOR-XR) 37.5 MG 24 hr capsule Take 112.5 mg by mouth every morning.    Allergies: Patient is allergic to wellbutrin [bupropion], aripiprazole, and statins. Family History: Patient family history includes Coronary artery disease in her brother and father;  Hypertension in her mother; Stroke in her mother. Social History:  Patient  reports that she quit smoking about 21 years ago. Her smoking use included cigarettes. She has never used smokeless tobacco. She reports current alcohol use of about 2.0 standard drinks of alcohol per week. She reports that she does not use drugs.  Review of Systems: Constitutional: Negative for fever malaise or anorexia Cardiovascular: negative for chest pain Respiratory: negative for SOB or persistent cough Gastrointestinal: negative for abdominal pain  OBJECTIVE Vitals: Ht 5\' 7"  (1.702 m)   Wt 176 lb (79.8 kg)   BMI 27.57 kg/m  General: no acute distress ,  A&Ox3 Congested, hoarse voice.  Some coughing.  No respiratory distress  [name] Ora, MD

## 2022-09-09 ENCOUNTER — Telehealth: Payer: Self-pay | Admitting: Family Medicine

## 2022-09-09 NOTE — Telephone Encounter (Signed)
Patient called back stating that she is still having symptoms. Per f/u instructions on AVS, pt was scheduled for next available with Dulce Sellar @ 4:20 on 09/10/22. Please advise if any further action needed.

## 2022-09-10 ENCOUNTER — Ambulatory Visit: Payer: BC Managed Care – PPO | Admitting: Family

## 2022-09-10 VITALS — BP 116/73 | HR 80 | Temp 97.8°F | Ht 67.0 in | Wt 183.8 lb

## 2022-09-10 DIAGNOSIS — J209 Acute bronchitis, unspecified: Secondary | ICD-10-CM | POA: Diagnosis not present

## 2022-09-10 NOTE — Progress Notes (Signed)
Patient ID: [name] [name], female    DOB: [birth year], 62 y.o.   MRN: [id number]  Chief Complaint  Patient presents with   Sinus Problem    Pt was seen on 5/8 by Dr. Mardelle Matte. Sx started on 4/25. Pt is still having a cough with green mucus and a sore throat. Has tried albuterol which does help SOB    HPI:      URI sx:  had virtual visit w/ another provider 6 days ago and given a Zpack, prednisone and cough syrup for her sx. States her cough is still bad with a sore throat, she denies fever, wheezing or SOB, but states she feels clammy, hx of tonsillectomy, has used an albuterol inhaler which has helped her sx.  Reports her left ear started hurting in last day or 2 but no other sinus sx.       Assessment & Plan:  1. Acute bronchitis, unspecified organism - lungs sound clear; will check CBC and would like to get CXR but pt states she can't take off more time from work. Advised I will review the CBC if normal, then I would advise continuing the prednisone and Albuterol inhaler. She should seek ER however, if sx worsen, or develops any fever over 100.0. Drink plenty of fluids.  - CBC with Differential/Platelet   Subjective:    Outpatient Medications Prior to Visit  Medication Sig Dispense Refill   albuterol (VENTOLIN HFA) 108 (90 Base) MCG/ACT inhaler Inhale 2 puffs into the lungs every 6 (six) hours as needed for wheezing or shortness of breath. 8 g 2   Ascorbic Acid (VITAMIN C) 1000 MG tablet Take 1,000 mg by mouth daily.     aspirin 81 MG EC tablet Take 81 mg by mouth daily.     atomoxetine (STRATTERA) 25 MG capsule Take 25 mg by mouth every morning.     azithromycin (ZITHROMAX) 250 MG tablet Take 2 tabs today, then 1 tab daily for 4 days 1 each 0   cholecalciferol (VITAMIN D) 1000 units tablet Take 1,000 Units by mouth daily.     ciprofloxacin (CILOXAN) 0.3 % ophthalmic solution Place 2 drops into both eyes every 4 (four) hours while awake. Administer 1 drop, every 2 hours, while awake, for  2 days. Then 1 drop, every 4 hours, while awake, for the next 5 days. 5 mL 0   Cyanocobalamin (VITAMIN B-12 PO) Take by mouth.     diazepam (VALIUM) 5 MG tablet Take 2.5-5 mg by mouth daily.     diclofenac Sodium (VOLTAREN) 1 % GEL Apply 2 g topically 4 (four) times daily as needed (hand pain). 150 g 5   Efinaconazole 10 % SOLN Apply 1 drop topically daily. 4 mL 11   ezetimibe (ZETIA) 10 MG tablet TAKE 1 TABLET(10 MG) BY MOUTH AT BEDTIME 90 tablet 3   guaiFENesin-codeine 100-10 MG/5ML syrup Take 5 mLs by mouth every 6 (six) hours as needed for cough. 120 mL 0   Multiple Vitamin (MULTIVITAMIN) tablet Take 1 tablet by mouth daily.     Omega-3 Fatty Acids (FISH OIL) 1000 MG CAPS Take by mouth.     predniSONE (DELTASONE) 10 MG tablet Take 4 tabs qd x 2 days, 3 qd x 2 days, 2 qd x 2d, 1qd x 3 days 21 tablet 0   rosuvastatin (CRESTOR) 5 MG tablet Take 1 tablet (5 mg total) by mouth at bedtime. 90 tablet 3   traZODone (DESYREL) 50 MG tablet Take by mouth.  venlafaxine XR (EFFEXOR-XR) 37.5 MG 24 hr capsule Take 112.5 mg by mouth every morning.     celecoxib (CELEBREX) 200 MG capsule Take 200 mg by mouth daily.     methylphenidate (RITALIN) 10 MG tablet Take 1 tablet (10 mg total) by mouth daily. 30 tablet 0   methylphenidate (RITALIN) 10 MG tablet Take 1 tablet (10 mg total) by mouth daily. 30 tablet 0   methylphenidate (RITALIN) 10 MG tablet Take 1 tablet (10 mg total) by mouth daily. 30 tablet 0   Varenicline Tartrate (TYRVAYA) 0.03 MG/ACT SOLN Place into the nose.     No facility-administered medications prior to visit.   Past Medical History:  Diagnosis Date   Atrial flutter with rapid ventricular response (HCC) 09/08/2011   Detached retina    High cholesterol    Hyperlipemia 09/08/2011   Past Surgical History:  Procedure Laterality Date   APPENDECTOMY     ATRIAL FLUTTER ABLATION N/A 12/15/2011   Procedure: ATRIAL FLUTTER ABLATION;  Surgeon: Marinus Maw, MD;  Location: [facility] CATH LAB;   Service: Cardiovascular;  Laterality: N/A;   CARDIOVERSION  12/04/2011   Procedure: CARDIOVERSION;  Surgeon: Thurmon Fair, MD;  Location: [facility] ENDOSCOPY;  Service: Cardiovascular;  Laterality: N/A;   TEE WITHOUT CARDIOVERSION  09/09/2011   Procedure: TRANSESOPHAGEAL ECHOCARDIOGRAM (TEE);  Surgeon: Thurmon Fair, MD;  Location: [facility] ENDOSCOPY;  Service: Cardiovascular;  Laterality: N/A;  anes. not able to come fo cardioversion / pt TBA but in ER   TEE WITHOUT CARDIOVERSION  12/04/2011   Procedure: TRANSESOPHAGEAL ECHOCARDIOGRAM (TEE);  Surgeon: Thurmon Fair, MD;  Location: [facility] ENDOSCOPY;  Service: Cardiovascular;  Laterality: N/A;   TONSILLECTOMY     TUBAL LIGATION     Allergies  Allergen Reactions   Wellbutrin [Bupropion] Anaphylaxis   Aripiprazole Swelling   Statins Other (See Comments)    Myalgias with Crestor and Lipitor 8 yrs ago; MUSCLE ACHES      Objective:    Physical Exam Vitals and nursing note reviewed.  Constitutional:      Appearance: Normal appearance. She is ill-appearing.     Interventions: Face mask in place.  HENT:     Right Ear: Tympanic membrane and ear canal normal.     Left Ear: Ear canal normal. Left ear tenderness: mild serous. A middle ear effusion is present. Tympanic membrane is injected.     Nose:     Right Sinus: No frontal sinus tenderness.     Left Sinus: No frontal sinus tenderness.     Mouth/Throat:     Mouth: Mucous membranes are moist.     Pharynx: Posterior oropharyngeal erythema present. No pharyngeal swelling, oropharyngeal exudate or uvula swelling.     Tonsils: No tonsillar exudate or tonsillar abscesses.  Cardiovascular:     Rate and Rhythm: Normal rate and regular rhythm.  Pulmonary:     Effort: Pulmonary effort is normal.     Breath sounds: Normal breath sounds.  Musculoskeletal:        General: Normal range of motion.  Lymphadenopathy:     Head:     Right side of head: No preauricular or posterior auricular adenopathy.     Left side of  head: No preauricular or posterior auricular adenopathy.     Cervical: No cervical adenopathy.  Skin:    General: Skin is warm and dry.  Neurological:     Mental Status: She is alert.  Psychiatric:        Mood and Affect: Mood normal.  Behavior: Behavior normal.    BP 116/73   Pulse 80   Temp 97.8 F (36.6 C) (Temporal)   Ht 5\' 7"  (1.702 m)   Wt 183 lb 12.8 oz (83.4 kg)   SpO2 98%   BMI 28.79 kg/m  Wt Readings from Last 3 Encounters:  09/10/22 183 lb 12.8 oz (83.4 kg)  09/04/22 176 lb (79.8 kg)  01/26/22 179 lb (81.2 kg)      Dulce Sellar, NP

## 2022-09-11 ENCOUNTER — Other Ambulatory Visit: Payer: Self-pay | Admitting: Family Medicine

## 2022-09-11 LAB — CBC WITH DIFFERENTIAL/PLATELET
Basophils Absolute: 0.1 10*3/uL (ref 0.0–0.1)
Basophils Relative: 0.9 % (ref 0.0–3.0)
Eosinophils Absolute: 0.4 10*3/uL (ref 0.0–0.7)
Eosinophils Relative: 3.5 % (ref 0.0–5.0)
HCT: 35.7 % — ABNORMAL LOW (ref 36.0–46.0)
Hemoglobin: 12.1 g/dL (ref 12.0–15.0)
Lymphocytes Relative: 17.4 % (ref 12.0–46.0)
Lymphs Abs: 1.9 10*3/uL (ref 0.7–4.0)
MCHC: 33.8 g/dL (ref 30.0–36.0)
MCV: 94.2 fl (ref 78.0–100.0)
Monocytes Absolute: 0.8 10*3/uL (ref 0.1–1.0)
Monocytes Relative: 7.6 % (ref 3.0–12.0)
Neutro Abs: 7.8 10*3/uL — ABNORMAL HIGH (ref 1.4–7.7)
Neutrophils Relative %: 70.6 % (ref 43.0–77.0)
Platelets: 357 10*3/uL (ref 150.0–400.0)
RBC: 3.8 Mil/uL — ABNORMAL LOW (ref 3.87–5.11)
RDW: 12.7 % (ref 11.5–15.5)
WBC: 11 10*3/uL — ABNORMAL HIGH (ref 4.0–10.5)

## 2022-09-11 MED ORDER — AMOXICILLIN-POT CLAVULANATE 875-125 MG PO TABS
1.0000 | ORAL_TABLET | Freq: Two times a day (BID) | ORAL | 0 refills | Status: AC
Start: 2022-09-11 — End: ?

## 2022-09-11 NOTE — Addendum Note (Signed)
Addended byDulce Sellar on: 09/11/2022 01:28 PM   Modules accepted: Orders

## 2022-09-11 NOTE — Progress Notes (Signed)
Call [name] [name] and let her know it does look like her infection is bacterial and I have sent over Augmentin antibiotic for her to start. Be sure to eat, wait 20 minutes then take the pill, bid for 7 days. thx

## 2023-01-21 ENCOUNTER — Other Ambulatory Visit: Payer: Self-pay | Admitting: Family Medicine

## 2023-01-29 ENCOUNTER — Ambulatory Visit: Payer: BC Managed Care – PPO | Admitting: Family

## 2023-01-29 ENCOUNTER — Encounter: Payer: Self-pay | Admitting: Family

## 2023-01-29 VITALS — BP 109/61 | Temp 97.5°F | Ht 67.0 in | Wt 177.4 lb

## 2023-01-29 DIAGNOSIS — R6889 Other general symptoms and signs: Secondary | ICD-10-CM | POA: Diagnosis not present

## 2023-01-29 LAB — POC COVID19 BINAXNOW: SARS Coronavirus 2 Ag: NEGATIVE

## 2023-01-29 LAB — POCT INFLUENZA A/B
Influenza A, POC: NEGATIVE
Influenza B, POC: NEGATIVE

## 2023-01-29 NOTE — Progress Notes (Signed)
Patient ID: [name] [name], female    DOB: 08-11-1960, 62 y.o.   MRN: [id number]  Chief Complaint  Patient presents with   Sinus Problem    Pt c/o body aches, fatigue, headache, dry cough, present since yesterday. Has tried ibuprofen which did help slightly. Covid negative yesterday at home.    *Discussed the use of AI scribe software for clinical note transcription with the patient, who gave verbal consent to proceed.  History of Present Illness   The patient, with a history of COVID-19 and asthma, presents with general malaise, dizziness, and ocular discomfort. They report that these symptoms began yesterday and have been associated with aching throughout the body. They have been managing the discomfort with ibuprofen. They have not received the flu vaccine this year. 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  The patient also reports that they have been using an albuterol inhaler as needed since their initial COVID-19 infection to assist with breathing, particularly when they are ill. They are currently out of this medication. They also report a runny nose that began this morning.      Assessment & Plan:     Flu like symptoms - Negative for flu and COVID-19. Symptoms include dizziness, ocular pain, and general aches. No fever or chills. Lungs clear on auscultation. -Continue ibuprofen or consider switching to Aleve for longer acting relief. -Stay hydrated. -Use Flonase (pt has at home) for nasal symptoms, start bid for 3-4d, then qd. -Notify office if symptoms worsen. -Send refill for albuterol inhaler. -Use 1-2 puffs as needed, especially before bedtime.  General Health Maintenance -Consider getting flu shot.      Subjective:    Outpatient Medications Prior to Visit  Medication Sig Dispense Refill   albuterol (VENTOLIN HFA) 108 (90 Base) MCG/ACT inhaler Inhale 2 puffs into the lungs every 6 (six) hours as needed for wheezing or shortness of breath.  8 g 2   aspirin 81 MG EC tablet Take 81 mg by mouth daily.     atomoxetine (STRATTERA) 25 MG capsule Take 25 mg by mouth every morning.     cholecalciferol (VITAMIN D) 1000 units tablet Take 1,000 Units by mouth daily.     ciprofloxacin (CILOXAN) 0.3 % ophthalmic solution Place 2 drops into both eyes every 4 (four) hours while awake. Administer 1 drop, every 2 hours, while awake, for 2 days. Then 1 drop, every 4 hours, while awake, for the next 5 days. 5 mL 0   diazepam (VALIUM) 5 MG tablet Take 2.5-5 mg by mouth daily.     diclofenac Sodium (VOLTAREN) 1 % GEL Apply 2 g topically 4 (four) times daily as needed (hand pain). 150 g 5   ezetimibe (ZETIA) 10 MG tablet TAKE 1 TABLET(10 MG) BY MOUTH AT BEDTIME 90 tablet 3   Multiple Vitamin (MULTIVITAMIN) tablet Take 1 tablet by mouth daily.     Omega-3 Fatty Acids (FISH OIL) 1000 MG CAPS Take by mouth.     rosuvastatin (CRESTOR) 5 MG tablet Take 1 tablet (5 mg total) by mouth at bedtime. 90 tablet 3   traZODone (DESYREL) 50 MG tablet Take by mouth.     venlafaxine XR (EFFEXOR-XR) 37.5 MG 24 hr capsule Take 112.5 mg by mouth every morning.     Cyanocobalamin (VITAMIN B-12 PO) Take by mouth.     amoxicillin-clavulanate (AUGMENTIN) 875-125 MG tablet Take 1 tablet by mouth 2 (two) times daily after a meal. 14 tablet 0   Ascorbic Acid (  VITAMIN C) 1000 MG tablet Take 1,000 mg by mouth daily.     azithromycin (ZITHROMAX) 250 MG tablet Take 2 tabs today, then 1 tab daily for 4 days 1 each 0   Efinaconazole 10 % SOLN Apply 1 drop topically daily. 4 mL 11   guaiFENesin-codeine 100-10 MG/5ML syrup Take 5 mLs by mouth every 6 (six) hours as needed for cough. 120 mL 0   predniSONE (DELTASONE) 10 MG tablet Take 4 tabs qd x 2 days, 3 qd x 2 days, 2 qd x 2d, 1qd x 3 days 21 tablet 0   No facility-administered medications prior to visit.   Past Medical History:  Diagnosis Date   Atrial flutter with rapid ventricular response (HCC) 09/08/2011   Detached retina     High cholesterol    Hyperlipemia 09/08/2011   Past Surgical History:  Procedure Laterality Date   APPENDECTOMY     ATRIAL FLUTTER ABLATION N/A 12/15/2011   Procedure: ATRIAL FLUTTER ABLATION;  Surgeon: Marinus Maw, MD;  Location: [facility] CATH LAB;  Service: Cardiovascular;  Laterality: N/A;   CARDIOVERSION  12/04/2011   Procedure: CARDIOVERSION;  Surgeon: Thurmon Fair, MD;  Location: [facility] ENDOSCOPY;  Service: Cardiovascular;  Laterality: N/A;   TEE WITHOUT CARDIOVERSION  09/09/2011   Procedure: TRANSESOPHAGEAL ECHOCARDIOGRAM (TEE);  Surgeon: Thurmon Fair, MD;  Location: [facility] ENDOSCOPY;  Service: Cardiovascular;  Laterality: N/A;  anes. not able to come fo cardioversion / pt TBA but in ER   TEE WITHOUT CARDIOVERSION  12/04/2011   Procedure: TRANSESOPHAGEAL ECHOCARDIOGRAM (TEE);  Surgeon: Thurmon Fair, MD;  Location: [facility] ENDOSCOPY;  Service: Cardiovascular;  Laterality: N/A;   TONSILLECTOMY     TUBAL LIGATION     Allergies  Allergen Reactions   Wellbutrin [Bupropion] Anaphylaxis   Aripiprazole Swelling   Statins Other (See Comments)    Myalgias with Crestor and Lipitor 8 yrs ago; MUSCLE ACHES      Objective:    Physical Exam Vitals and nursing note reviewed.  Constitutional:      Appearance: Normal appearance. She is ill-appearing.     Interventions: Face mask in place.  HENT:     Right Ear: Tympanic membrane and ear canal normal.     Left Ear: Tympanic membrane and ear canal normal.     Nose:     Right Sinus: No frontal sinus tenderness.     Left Sinus: No frontal sinus tenderness.     Mouth/Throat:     Mouth: Mucous membranes are moist.     Pharynx: Posterior oropharyngeal erythema present. No pharyngeal swelling, oropharyngeal exudate or uvula swelling.     Tonsils: No tonsillar exudate or tonsillar abscesses.  Cardiovascular:     Rate and Rhythm: Normal rate and regular rhythm.  Pulmonary:     Effort: Pulmonary effort is normal.     Breath sounds: Normal breath sounds.   Musculoskeletal:        General: Normal range of motion.  Lymphadenopathy:     Head:     Right side of head: No preauricular or posterior auricular adenopathy.     Left side of head: No preauricular or posterior auricular adenopathy.     Cervical: No cervical adenopathy.  Skin:    General: Skin is warm and dry.  Neurological:     Mental Status: She is alert.  Psychiatric:        Mood and Affect: Mood normal.        Behavior: Behavior normal.    BP 109/61 (BP Location:  Left Arm, Patient Position: Sitting, Cuff Size: Normal)   Temp (!) 97.5 F (36.4 C) (Temporal)   Ht 5\' 7"  (1.702 m)   Wt 177 lb 6.4 oz (80.5 kg)   BMI 27.78 kg/m  Wt Readings from Last 3 Encounters:  01/29/23 177 lb 6.4 oz (80.5 kg)  09/10/22 183 lb 12.8 oz (83.4 kg)  09/04/22 176 lb (79.8 kg)       Dulce Sellar, NP

## 2023-01-30 ENCOUNTER — Other Ambulatory Visit: Payer: Self-pay | Admitting: Family Medicine

## 2023-02-03 ENCOUNTER — Encounter: Payer: Self-pay | Admitting: Family

## 2023-02-03 DIAGNOSIS — R053 Chronic cough: Secondary | ICD-10-CM

## 2023-02-04 MED ORDER — AZITHROMYCIN 250 MG PO TABS
ORAL_TABLET | ORAL | 0 refills | Status: AC
Start: 2023-02-04 — End: 2023-02-09

## 2023-02-04 NOTE — Telephone Encounter (Signed)
Zpak sent in - please let pt know, thx

## 2023-02-19 ENCOUNTER — Ambulatory Visit (INDEPENDENT_AMBULATORY_CARE_PROVIDER_SITE_OTHER): Payer: BC Managed Care – PPO | Admitting: Family Medicine

## 2023-02-19 ENCOUNTER — Other Ambulatory Visit (HOSPITAL_COMMUNITY)
Admission: RE | Admit: 2023-02-19 | Discharge: 2023-02-19 | Disposition: A | Discharge status: Home / Self Care | Payer: BC Managed Care – PPO | Source: Ambulatory Visit | Attending: Family Medicine | Admitting: Family Medicine

## 2023-02-19 ENCOUNTER — Encounter: Payer: Self-pay | Admitting: Family Medicine

## 2023-02-19 VITALS — BP 110/62 | HR 74 | Temp 98.7°F | Ht 67.0 in | Wt 177.2 lb

## 2023-02-19 DIAGNOSIS — Z23 Encounter for immunization: Secondary | ICD-10-CM | POA: Diagnosis not present

## 2023-02-19 DIAGNOSIS — Z124 Encounter for screening for malignant neoplasm of cervix: Secondary | ICD-10-CM | POA: Diagnosis present

## 2023-02-19 DIAGNOSIS — F339 Major depressive disorder, recurrent, unspecified: Secondary | ICD-10-CM

## 2023-02-19 DIAGNOSIS — Z0001 Encounter for general adult medical examination with abnormal findings: Secondary | ICD-10-CM

## 2023-02-19 DIAGNOSIS — Z0000 Encounter for general adult medical examination without abnormal findings: Secondary | ICD-10-CM | POA: Diagnosis not present

## 2023-02-19 DIAGNOSIS — F988 Other specified behavioral and emotional disorders with onset usually occurring in childhood and adolescence: Secondary | ICD-10-CM | POA: Diagnosis not present

## 2023-02-19 DIAGNOSIS — E78019 Familial hypercholesterolemia, unspecified: Secondary | ICD-10-CM

## 2023-02-19 DIAGNOSIS — E7801 Familial hypercholesterolemia: Secondary | ICD-10-CM | POA: Diagnosis not present

## 2023-02-19 DIAGNOSIS — Z8679 Personal history of other diseases of the circulatory system: Secondary | ICD-10-CM

## 2023-02-19 DIAGNOSIS — R7301 Impaired fasting glucose: Secondary | ICD-10-CM

## 2023-02-19 LAB — LIPID PANEL
Cholesterol: 175 mg/dL (ref 0–200)
HDL: 62.6 mg/dL (ref >39.00)
LDL Cholesterol: 98 mg/dL (ref 0–99)
NonHDL: 112.28
Total CHOL/HDL Ratio: 3
Triglycerides: 70 mg/dL (ref 0.0–149.0)
VLDL: 14 mg/dL (ref 0.0–40.0)

## 2023-02-19 LAB — COMPREHENSIVE METABOLIC PANEL
ALT: 18 U/L (ref 0–35)
AST: 18 U/L (ref 0–37)
Albumin: 4.1 g/dL (ref 3.5–5.2)
Alkaline Phosphatase: 49 U/L (ref 39–117)
BUN: 22 mg/dL (ref 6–23)
CO2: 31 meq/L (ref 19–32)
Calcium: 9.7 mg/dL (ref 8.4–10.5)
Chloride: 105 meq/L (ref 96–112)
Creatinine, Ser: 0.7 mg/dL (ref 0.40–1.20)
GFR: 92.12 mL/min (ref >60.00)
Glucose, Bld: 90 mg/dL (ref 70–99)
Potassium: 3.9 meq/L (ref 3.5–5.1)
Sodium: 143 meq/L (ref 135–145)
Total Bilirubin: 0.6 mg/dL (ref 0.2–1.2)
Total Protein: 6.4 g/dL (ref 6.0–8.3)

## 2023-02-19 LAB — CBC WITH DIFFERENTIAL/PLATELET
Basophils Absolute: 0 10*3/uL (ref 0.0–0.1)
Basophils Relative: 0.3 % (ref 0.0–3.0)
Eosinophils Absolute: 0.2 10*3/uL (ref 0.0–0.7)
Eosinophils Relative: 5.1 % — ABNORMAL HIGH (ref 0.0–5.0)
HCT: 40.1 % (ref 36.0–46.0)
Hemoglobin: 13.1 g/dL (ref 12.0–15.0)
Lymphocytes Relative: 22.6 % (ref 12.0–46.0)
Lymphs Abs: 0.9 10*3/uL (ref 0.7–4.0)
MCHC: 32.5 g/dL (ref 30.0–36.0)
MCV: 95.3 fL (ref 78.0–100.0)
Monocytes Absolute: 0.4 10*3/uL (ref 0.1–1.0)
Monocytes Relative: 8.8 % (ref 3.0–12.0)
Neutro Abs: 2.6 10*3/uL (ref 1.4–7.7)
Neutrophils Relative %: 63.2 % (ref 43.0–77.0)
Platelets: 335 10*3/uL (ref 150.0–400.0)
RBC: 4.21 Mil/uL (ref 3.87–5.11)
RDW: 13.4 % (ref 11.5–15.5)
WBC: 4.1 10*3/uL (ref 4.0–10.5)

## 2023-02-19 LAB — VITAMIN D 25 HYDROXY (VIT D DEFICIENCY, FRACTURES): VITD: 58.02 ng/mL (ref 30.00–100.00)

## 2023-02-19 LAB — HEMOGLOBIN A1C: Hgb A1c MFr Bld: 5.7 % (ref 4.6–6.5)

## 2023-02-19 LAB — TSH: TSH: 0.67 u[IU]/mL (ref 0.35–5.50)

## 2023-02-19 MED ORDER — ROSUVASTATIN CALCIUM 5 MG PO TABS: 10.0000 mg | ORAL_TABLET | Freq: Every day | ORAL | Status: DC

## 2023-02-19 MED ORDER — EZETIMIBE 10 MG PO TABS: ORAL_TABLET | ORAL | 3 refills | Status: DC

## 2023-02-19 NOTE — Progress Notes (Unsigned)
Subjective  Chief Complaint  Patient presents with   Annual Exam    Pt here for Annual Exam and is currently fasting. Pt stated that she had a cologuard done     HPI: [name] [name] is a 62 y.o. female who presents to [facility] [place] today for a Female Wellness Visit. She also has the concerns and/or needs as listed above in the chief complaint. These will be addressed in addition to the Health Maintenance Visit.   Wellness Visit: annual visit with health maintenance review and exam  HM: due pap smear today. Mammo due next march. Cologuard negative in 2023. Doing well overall. 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 Flu shot today. Other imms current Chronic disease f/u and/or acute problem visit: (deemed necessary to be done in addition to the wellness visit): Depression and ADD: on mood meds and now stratera. Stable.  IFG: eating well. Weight is stable but weight loss has plateaued.  HLD on crestor 10 and zetia 10. Tolerating well. Fasting for recheck.   Assessment  1. Encounter for well adult exam with abnormal findings   2. Need for influenza vaccination   3. Familial hypercholesteremia   4. Chronic recurrent major depressive disorder (HCC)   5. Attention deficit disorder (ADD) without hyperactivity   6. History of atrial flutter   7. Cervical cancer screening   8. IFG (impaired fasting glucose)      Plan  Female Wellness Visit: Age appropriate Health Maintenance and Prevention measures were discussed with patient. Included topics are cancer screening recommendations, ways to keep healthy (see AVS) including dietary and exercise recommendations, regular eye and dental care, use of seat belts, and avoidance of moderate alcohol use and tobacco use. Pap w/ HR hpv screen today. Mammo current. Cologuard current BMI: discussed patient's BMI and encouraged positive lifestyle modifications to help get to or maintain a target BMI. HM needs and  immunizations were addressed and ordered. See below for orders. See HM and immunization section for updates. Flu shot today.  Routine labs and screening tests ordered including cmp, cbc and lipids where appropriate. Discussed recommendations regarding Vit D and calcium supplementation (see AVS)  Chronic disease management visit and/or acute problem visit: HLD: recheck lipids on zetia and crestor 10. Adjust dose as needed. Tolerating statin fortunately Depression and ADD: continue mood meds and strattera. Stable. IFG: continue healthy diet. No sxs of hyperglycemia. Check A1c to monitor.  No palpitations or cp  Follow up: 12 mo for cpe  Orders Placed This Encounter  Procedures   Flu vaccine trivalent PF, 6mos and older(Flulaval,Afluria,Fluarix,Fluzone)   VITAMIN D 25 Hydroxy (Vit-D Deficiency, Fractures)   CBC with Differential/Platelet   Comprehensive metabolic panel   Lipid panel   TSH   Hemoglobin A1c   Meds ordered this encounter  Medications   ezetimibe (ZETIA) 10 MG tablet    Sig: TAKE 1 TABLET(10 MG) BY MOUTH AT BEDTIME    Dispense:  90 tablet    Refill:  3   rosuvastatin (CRESTOR) 5 MG tablet    Sig: Take 2 tablets (10 mg total) by mouth at bedtime.      Body mass index is 27.75 kg/m. Wt Readings from Last 3 Encounters:  02/19/23 177 lb 3.2 oz (80.4 kg)  01/29/23 177 lb 6.4 oz (80.5 kg)  09/10/22 183 lb 12.8 oz (83.4 kg)     Patient Active Problem List   Diagnosis Date Noted Date Diagnosed   Attention deficit  disorder (ADD) without hyperactivity 12/24/2021     Priority: High    Had palpitations with stimulants; strattera now per psych    Tardive dyskinesia 06/03/2018     Priority: High   Familial hypercholesteremia 07/26/2015     Priority: High   Family history of premature CAD 07/26/2012     Priority: High    Brother and father    Chronic recurrent major depressive disorder (HCC) 09/09/2011     Priority: High    [name], FNP psych; failed long  term effexor, abilify and seroquel    Primary osteoarthritis of both hands 12/24/2021     Priority: Medium    Postherpetic neuralgia 06/06/2019     Priority: Medium    Retinal detachment 12/31/2011     Priority: Medium     Overview:  Dr Stephannie Li, retinal specialist    History of atrial flutter 09/29/2011     Priority: Medium     Overview:  Atrial flutter - s/p cardioversion x 2, s/p ablation 12/2011 [facility], Dr. Allyson Sabal    Atrophic vaginitis 06/03/2018     Priority: Low   AR (allergic rhinitis) 08/01/2010     Priority: Low   Health Maintenance  Topic Date Due   Cervical Cancer Screening (HPV/Pap Cotest)  06/03/2021   COVID-19 Vaccine (4 - 2023-24 season) 03/07/2023 (Originally 01/03/2023)   INFLUENZA VACCINE  08/02/2023 (Originally 12/03/2022)   MAMMOGRAM  07/09/2023   DTaP/Tdap/Td (3 - Td or Tdap) 08/06/2023   Fecal DNA (Cologuard)  01/26/2025   Hepatitis C Screening  Completed   HIV Screening  Completed   Zoster Vaccines- Shingrix  Completed   HPV VACCINES  Aged Out   Colonoscopy  Discontinued   Immunization History  Administered Date(s) Administered   Influenza,inj,Quad PF,6+ Mos 04/30/2017, 03/04/2018, 01/30/2019, 01/26/2022   Influenza-Unspecified 01/09/2021   PFIZER(Purple Top)SARS-COV-2 Vaccination 06/30/2019, 07/21/2019, 04/21/2020   Tdap 10/07/2010, 08/05/2013   Zoster Recombinant(Shingrix) 10/10/2020, 12/10/2020   We updated and reviewed the patient's past history in detail and it is documented below. Allergies: Patient is allergic to wellbutrin [bupropion], aripiprazole, and statins. Past Medical History Patient  has a past medical history of Atrial flutter with rapid ventricular response (HCC) (09/08/2011), Detached retina, High cholesterol, and Hyperlipemia (09/08/2011). Past Surgical History Patient  has a past surgical history that includes Appendectomy; Tonsillectomy; Tubal ligation; TEE without cardioversion (09/09/2011); TEE without  cardioversion (12/04/2011); Cardioversion (12/04/2011); and Atrial flutter ablation (N/A, 12/15/2011). Family History: Patient family history includes Coronary artery disease in her brother and father; Hypertension in her mother; Stroke in her mother. Social History:  Patient  reports that she quit smoking about 21 years ago. Her smoking use included cigarettes. She has never used smokeless tobacco. She reports current alcohol use of about 2.0 standard drinks of alcohol per week. She reports that she does not use drugs.  Review of Systems: Constitutional: negative for fever or malaise Ophthalmic: negative for photophobia, double vision or loss of vision Cardiovascular: negative for chest pain, dyspnea on exertion, or new LE swelling Respiratory: negative for SOB or persistent cough Gastrointestinal: negative for abdominal pain, change in bowel habits or melena Genitourinary: negative for dysuria or gross hematuria, no abnormal uterine bleeding or disharge Musculoskeletal: negative for new gait disturbance or muscular weakness Integumentary: negative for new or persistent rashes, no breast lumps Neurological: negative for TIA or stroke symptoms Psychiatric: negative for SI or delusions Allergic/Immunologic: negative for hives  Patient Care Team    Relationship Specialty Notifications Start End  Willow Ora, MD PCP - General Family Medicine  04/30/17   Jodelle Red, MD Consulting Physician Cardiology  06/06/19   [name] FNP Consulting Physician Psychiatry  06/06/19   Jodi Geralds, MD Consulting Physician Orthopedic Surgery  09/04/22     Objective  Vitals: BP 110/62   Pulse 74   Temp 98.7 F (37.1 C)   Ht 5\' 7"  (1.702 m)   Wt 177 lb 3.2 oz (80.4 kg)   SpO2 96%   BMI 27.75 kg/m  General:  Well developed, well nourished, no acute distress  Psych:  Alert and orientedx3,normal mood and affect HEENT:  Normocephalic, atraumatic, non-icteric sclera,  supple neck without  adenopathy, mass or thyromegaly Cardiovascular:  Normal S1, S2, RRR without gallop, rub or murmur Respiratory:  Good breath sounds bilaterally, CTAB with normal respiratory effort Gastrointestinal: normal bowel sounds, soft, non-tender, no noted masses. No HSM MSK: extremities without edema, joints without erythema or swelling Neurologic:    Mental status is normal.  Gross motor and sensory exams are normal.  No tremor Pelvic Exam: Normal external genitalia, no vulvar or vaginal lesions present, atrophic vaginal mucosa. Clear cervix w/o CMT. Bimanual exam reveals a nontender fundus w/o masses, nl size. No adnexal masses present. No inguinal adenopathy. A PAP smear was performed.    Commons side effects, risks, benefits, and alternatives for medications and treatment plan prescribed today were discussed, and the patient expressed understanding of the given instructions. Patient is instructed to call or message via MyChart if he/she has any questions or concerns regarding our treatment plan. No barriers to understanding were identified. We discussed Red Flag symptoms and signs in detail. Patient expressed understanding regarding what to do in case of urgent or emergency type symptoms.  Medication list was reconciled, printed and provided to the patient in AVS. Patient instructions and summary information was reviewed with the patient as documented in the AVS. This note was prepared with assistance of Dragon voice recognition software. Occasional wrong-word or sound-a-like substitutions may have occurred due to the inherent limitations of voice recognition software

## 2023-02-20 MED ORDER — ROSUVASTATIN CALCIUM 10 MG PO TABS: 10.0000 mg | ORAL_TABLET | Freq: Every day | ORAL | 3 refills | Status: AC

## 2023-02-20 NOTE — Progress Notes (Signed)
Labs reviewed.  The 10-year ASCVD risk score (Arnett DK, et al., 2019) is: 2.9% on crestor and zetia   Values used to calculate the score:     Age: 62 years     Sex: Female     Is Non-Hispanic African American: No     Diabetic: No     Tobacco smoker: No     Systolic Blood Pressure: 110 mmHg     Is BP treated: No     HDL Cholesterol: 62.6 mg/dL     Total Cholesterol: 175 mg/dL

## 2023-02-23 LAB — CYTOLOGY - PAP
Comment: NEGATIVE
Diagnosis: NEGATIVE
High risk HPV: NEGATIVE

## 2023-02-24 NOTE — Progress Notes (Signed)
See my chart note.

## 2023-07-20 ENCOUNTER — Encounter: Payer: Self-pay | Admitting: Family

## 2023-07-20 ENCOUNTER — Ambulatory Visit: Admitting: Family

## 2023-07-20 VITALS — BP 122/79 | HR 76 | Temp 98.7°F | Ht 67.0 in | Wt 177.0 lb

## 2023-07-20 DIAGNOSIS — H60392 Other infective otitis externa, left ear: Secondary | ICD-10-CM

## 2023-07-20 DIAGNOSIS — R053 Chronic cough: Secondary | ICD-10-CM | POA: Diagnosis not present

## 2023-07-20 MED ORDER — PREDNISONE 10 MG PO TABS: ORAL_TABLET | ORAL | 0 refills | Status: DC

## 2023-07-20 MED ORDER — HYDROCORTISONE-ACETIC ACID 1-2 % OT SOLN
3.0000 [drp] | Freq: Two times a day (BID) | OTIC | 0 refills | Status: AC
Start: 2023-07-20 — End: 2023-07-27

## 2023-07-20 MED ORDER — BENZONATATE 100 MG PO CAPS: 100.0000 mg | ORAL_CAPSULE | Freq: Two times a day (BID) | ORAL | 0 refills | Status: DC | PRN

## 2023-07-20 NOTE — Progress Notes (Signed)
 Patient ID: [name] [name], female    DOB: Sep 25, 1960, 63 y.o.   MRN: [id number]  Chief Complaint  Patient presents with   Cough    Pt c/o headache and cough with green mucus, present since Sunday. Has tried dayquil and acetaminophen which did help headaches.        Discussed the use of AI scribe software for clinical note transcription with the patient, who gave verbal consent to proceed.  History of Present Illness   The patient, with a history of bronchitis, presents with a 6 day history of upper respiratory symptoms including a cough, runny nose, and loss of voice. The cough has been productive of green mucus. The patient also reports a headache and sinus pressure. The patient has been using an albuterol inhalerprn for symptom management. The patient denies any fever but reports feeling tired. The patient also reports some mild hearing loss and ear congestion. The patient has been managing symptoms with over-the-counter DayQuil and Tylenol. The patient has a history of bronchitis and typically uses an albuterol inhaler during bouts of illness.     Assessment & Plan:     Acute Viral Upper Respiratory Infection  - Symptoms consistent with viral URI. History of similar episodes. Albuterol provides some relief. Prednisone recommended for inflammation. Tessalon Perles for cough. Emphasized viral nature of bronchitis, steroids usually required, antibiotics secondary. - Prescribing prednisone with tapering dose, morning post-breakfast, ok to start today, but may have difficulty falling asleep tonight. - Continue albuterol inhaler morning, midday prn, and hour before bedtime. - Refilling Tessalon Perles for cough. - Advised on humidifier use overnight. - Encourage hydration, 2L daily.  Left Ear Canal Swelling - Swelling and erythema in left ear canal, no tympanic membrane infection. Auditory difficulty noted. Hydrocortisone ear drops prescribed. - Prescribe hydrocortisone ear drops for left  ear. - Advise follow-up if no improvement or further evaluation needed.  Follow-up Advised monitoring symptoms and reporting if no improvement by week's end. Option for message communication without in-person visit unless necessary. - Report by end of week if no improvement. - Option to communicate via message for further management.      Subjective:    Outpatient Medications Prior to Visit  Medication Sig Dispense Refill   albuterol (VENTOLIN HFA) 108 (90 Base) MCG/ACT inhaler Inhale 2 puffs into the lungs every 6 (six) hours as needed for wheezing or shortness of breath. 18 g 2   aspirin 81 MG EC tablet Take 81 mg by mouth daily.     atomoxetine (STRATTERA) 25 MG capsule Take 25 mg by mouth every morning.     cholecalciferol (VITAMIN D) 1000 units tablet Take 1,000 Units by mouth daily.     diazepam (VALIUM) 5 MG tablet Take 2.5-5 mg by mouth daily.     diclofenac Sodium (VOLTAREN) 1 % GEL Apply 2 g topically 4 (four) times daily as needed (hand pain). 150 g 5   ezetimibe (ZETIA) 10 MG tablet TAKE 1 TABLET(10 MG) BY MOUTH AT BEDTIME 90 tablet 3   Multiple Vitamin (MULTIVITAMIN) tablet Take 1 tablet by mouth daily.     Omega-3 Fatty Acids (FISH OIL) 1000 MG CAPS Take by mouth.     rosuvastatin (CRESTOR) 10 MG tablet Take 1 tablet (10 mg total) by mouth daily. 90 tablet 3   traZODone (DESYREL) 50 MG tablet Take by mouth.     venlafaxine XR (EFFEXOR-XR) 37.5 MG 24 hr capsule Take 112.5 mg by mouth every morning.     No  facility-administered medications prior to visit.   Past Medical History:  Diagnosis Date   Atrial flutter with rapid ventricular response (HCC) 09/08/2011   Detached retina    High cholesterol    Hyperlipemia 09/08/2011   Past Surgical History:  Procedure Laterality Date   APPENDECTOMY     ATRIAL FLUTTER ABLATION N/A 12/15/2011   Procedure: ATRIAL FLUTTER ABLATION;  Surgeon: Marinus Maw, MD;  Location: [facility] CATH LAB;  Service: Cardiovascular;  Laterality: N/A;    CARDIOVERSION  12/04/2011   Procedure: CARDIOVERSION;  Surgeon: Thurmon Fair, MD;  Location: [facility] ENDOSCOPY;  Service: Cardiovascular;  Laterality: N/A;   TEE WITHOUT CARDIOVERSION  09/09/2011   Procedure: TRANSESOPHAGEAL ECHOCARDIOGRAM (TEE);  Surgeon: Thurmon Fair, MD;  Location: [facility] ENDOSCOPY;  Service: Cardiovascular;  Laterality: N/A;  anes. not able to come fo cardioversion / pt TBA but in ER   TEE WITHOUT CARDIOVERSION  12/04/2011   Procedure: TRANSESOPHAGEAL ECHOCARDIOGRAM (TEE);  Surgeon: Thurmon Fair, MD;  Location: [facility] ENDOSCOPY;  Service: Cardiovascular;  Laterality: N/A;   TONSILLECTOMY     TUBAL LIGATION     Allergies  Allergen Reactions   Wellbutrin [Bupropion] Anaphylaxis   Aripiprazole Swelling   Statins Other (See Comments)    Myalgias with Crestor and Lipitor 8 yrs ago; MUSCLE ACHES      Objective:    Physical Exam Vitals and nursing note reviewed.  Constitutional:      Appearance: Normal appearance.  HENT:     Right Ear: Tympanic membrane and ear canal normal.     Left Ear: Tympanic membrane normal. Tenderness (ear canal with erythema and swelling noted) present.  Cardiovascular:     Rate and Rhythm: Normal rate and regular rhythm.  Pulmonary:     Effort: Pulmonary effort is normal.     Breath sounds: Normal breath sounds.  Musculoskeletal:        General: Normal range of motion.  Skin:    General: Skin is warm and dry.  Neurological:     Mental Status: She is alert.  Psychiatric:        Mood and Affect: Mood normal.        Behavior: Behavior normal.    BP 122/79 (BP Location: Left Arm, Patient Position: Sitting, Cuff Size: Normal)   Pulse 76   Temp 98.7 F (37.1 C) (Temporal)   Ht 5\' 7"  (1.702 m)   Wt 177 lb (80.3 kg)   SpO2 96%   BMI 27.72 kg/m  Wt Readings from Last 3 Encounters:  07/20/23 177 lb (80.3 kg)  02/19/23 177 lb 3.2 oz (80.4 kg)  01/29/23 177 lb 6.4 oz (80.5 kg)       Dulce Sellar, NP

## 2023-07-22 ENCOUNTER — Encounter: Payer: Self-pay | Admitting: Family

## 2023-07-23 ENCOUNTER — Other Ambulatory Visit: Payer: Self-pay

## 2023-07-23 MED ORDER — AZITHROMYCIN 250 MG PO TABS: ORAL_TABLET | ORAL | 0 refills | Status: DC

## 2023-08-06 ENCOUNTER — Other Ambulatory Visit (HOSPITAL_COMMUNITY): Payer: Self-pay

## 2024-04-05 ENCOUNTER — Emergency Department (HOSPITAL_COMMUNITY)

## 2024-04-05 ENCOUNTER — Emergency Department (HOSPITAL_COMMUNITY)
Admission: EM | Admit: 2024-04-05 | Discharge: 2024-04-05 | Disposition: A | Discharge status: Home / Self Care | Attending: Emergency Medicine | Admitting: Emergency Medicine

## 2024-04-05 ENCOUNTER — Other Ambulatory Visit: Payer: Self-pay

## 2024-04-05 ENCOUNTER — Encounter (HOSPITAL_COMMUNITY): Payer: Self-pay

## 2024-04-05 DIAGNOSIS — R0602 Shortness of breath: Secondary | ICD-10-CM | POA: Insufficient documentation

## 2024-04-05 DIAGNOSIS — Z7982 Long term (current) use of aspirin: Secondary | ICD-10-CM | POA: Diagnosis not present

## 2024-04-05 DIAGNOSIS — R0789 Other chest pain: Secondary | ICD-10-CM | POA: Insufficient documentation

## 2024-04-05 LAB — BASIC METABOLIC PANEL WITH GFR
Anion gap: 10 (ref 5–15)
BUN: 17 mg/dL (ref 8–23)
CO2: 28 mmol/L (ref 22–32)
Calcium: 9.4 mg/dL (ref 8.9–10.3)
Chloride: 101 mmol/L (ref 98–111)
Creatinine, Ser: 0.8 mg/dL (ref 0.44–1.00)
GFR, Estimated: >60 mL/min (ref >60)
Glucose, Bld: 146 mg/dL — ABNORMAL HIGH (ref 70–99)
Potassium: 4 mmol/L (ref 3.5–5.1)
Sodium: 139 mmol/L (ref 135–145)

## 2024-04-05 LAB — CBC
HCT: 39.8 % (ref 36.0–46.0)
Hemoglobin: 13.2 g/dL (ref 12.0–15.0)
MCH: 30.9 pg (ref 26.0–34.0)
MCHC: 33.2 g/dL (ref 30.0–36.0)
MCV: 93.2 fL (ref 80.0–100.0)
Platelets: 245 K/uL (ref 150–400)
RBC: 4.27 MIL/uL (ref 3.87–5.11)
RDW: 12.6 % (ref 11.5–15.5)
WBC: 5.6 K/uL (ref 4.0–10.5)
nRBC: 0 % (ref 0.0–0.2)

## 2024-04-05 LAB — TROPONIN I (HIGH SENSITIVITY)
Troponin I (High Sensitivity): <2 ng/L (ref <18)
Troponin I (High Sensitivity): <2 ng/L (ref <18)

## 2024-04-05 LAB — D-DIMER, QUANTITATIVE: D-Dimer, Quant: 0.28 ug{FEU}/mL (ref 0.00–0.50)

## 2024-04-05 NOTE — ED Provider Notes (Cosign Needed Addendum)
 [place] EMERGENCY DEPARTMENT AT [facility] Provider Note   CSN: [id number] Arrival date & time: 04/05/24  1208     Patient presents with: No chief complaint on file.   [name] [name] is a 63 y.o. female with history of hyperlipidemia, previous episode of A-flutter that has resolved, presents with concern for chest pressure that has been on and off for the past 2 days.  She states the pressure is nonexertional.  Nonpleuritic.  She does not feel short of breath.  Denies any chest pain, but does note some left shoulder pain.  Denies any injuries to the left shoulder.  Denies any nausea or vomiting.  No leg pain or swelling.  Denies any fever, cough.  No recent long plane or car rides, no recent hospitalizations or surgeries.  No history of blood clot.  She is not on any hormonal medications.   HPI     Prior to Admission medications   Medication Sig Start Date End Date Taking? Authorizing Provider  albuterol  (VENTOLIN  HFA) 108 (90 Base) MCG/ACT inhaler Inhale 2 puffs into the lungs every 6 (six) hours as needed for wheezing or shortness of breath. 02/01/23   Jodie Lavern CROME, MD  aspirin  81 MG EC tablet Take 81 mg by mouth daily.    [provider]  atomoxetine (STRATTERA) 25 MG capsule Take 25 mg by mouth every morning. 07/09/22   [provider]  azithromycin  (ZITHROMAX ) 250 MG tablet Take 2 tabs today, then 1 tab daily for 4 days 07/23/23   Jodie Lavern CROME, MD  benzonatate  (TESSALON ) 100 MG capsule Take 1-2 capsules (100-200 mg total) by mouth 2 (two) times daily as needed for cough. 07/20/23   Lucius Krabbe, NP  cholecalciferol (VITAMIN D ) 1000 units tablet Take 1,000 Units by mouth daily.    [provider]  diazepam (VALIUM) 5 MG tablet Take 2.5-5 mg by mouth daily. 05/13/21   [provider]  diclofenac  Sodium (VOLTAREN ) 1 % GEL Apply 2 g topically 4 (four) times daily as needed (hand pain). 12/24/21   Jodie Lavern CROME, MD  ezetimibe  (ZETIA )  10 MG tablet TAKE 1 TABLET(10 MG) BY MOUTH AT BEDTIME 02/19/23   Jodie Lavern CROME, MD  Multiple Vitamin (MULTIVITAMIN) tablet Take 1 tablet by mouth daily.    [provider]  Omega-3 Fatty Acids (FISH OIL) 1000 MG CAPS Take by mouth.    [provider]  predniSONE  (DELTASONE ) 10 MG tablet Take with breakfast:  5 tab day 1, 4 tab day 2-3, 3 tab day 4, 2 tab day 5-7 07/20/23   Lucius Krabbe, NP  rosuvastatin  (CRESTOR ) 10 MG tablet Take 1 tablet (10 mg total) by mouth daily. 02/20/23   Jodie Lavern CROME, MD  traZODone (DESYREL) 50 MG tablet Take by mouth. 10/20/21   [provider]  venlafaxine  XR (EFFEXOR -XR) 37.5 MG 24 hr capsule Take 112.5 mg by mouth every morning. 05/09/21   [provider]    Allergies: Wellbutrin [bupropion], Aripiprazole, and Statins    Review of Systems  Respiratory:  Positive for chest tightness. Negative for shortness of breath.     Updated Vital Signs BP 121/75   Pulse 62   Temp 97.9 F (36.6 C) (Oral)   Resp 13   Ht 5' 5 (1.651 m)   Wt 81.6 kg   SpO2 99%   BMI 29.95 kg/m   Physical Exam Vitals and nursing note reviewed.  Constitutional:      General: She is not  in acute distress.    Appearance: She is well-developed.  HENT:     Head: Normocephalic and atraumatic.  Eyes:     Conjunctiva/sclera: Conjunctivae normal.  Cardiovascular:     Rate and Rhythm: Normal rate and regular rhythm.     Heart sounds: No murmur heard. Pulmonary:     Effort: Pulmonary effort is normal. No respiratory distress.     Breath sounds: Normal breath sounds.  Abdominal:     Palpations: Abdomen is soft.     Tenderness: There is no abdominal tenderness.  Musculoskeletal:        General: No swelling.     Cervical back: Neck supple.     Comments: No lower extremity edema or calf tenderness to palpation on exam  Skin:    General: Skin is warm and dry.     Capillary Refill: Capillary refill takes less than 2 seconds.  Neurological:      Mental Status: She is alert.  Psychiatric:        Mood and Affect: Mood normal.     (all labs ordered are listed, but only abnormal results are displayed) Labs Reviewed  BASIC METABOLIC PANEL WITH GFR - Abnormal; Notable for the following components:      Result Value   Glucose, Bld 146 (*)    All other components within normal limits  CBC  D-DIMER, QUANTITATIVE  TROPONIN I (HIGH SENSITIVITY)  TROPONIN I (HIGH SENSITIVITY)    EKG: EKG Interpretation Date/Time:  Wednesday April 05 2024 12:23:32 EST Ventricular Rate:  88 PR Interval:  156 QRS Duration:  82 QT Interval:  356 QTC Calculation: 430 R Axis:   52  Text Interpretation: Normal sinus rhythm Normal ECG When compared with ECG of 16-Dec-2011 04:48, PREVIOUS ECG IS PRESENT Confirmed by Laurice Coy [id number]) on 04/05/2024 1:35:34 PM  Radiology: [facility] Chest 2 View Result Date: 04/05/2024 EXAM: 2 VIEW(S) XRAY OF THE CHEST 04/05/2024 12:40:00 PM COMPARISON: 07/27/2020 CLINICAL HISTORY: CP FINDINGS: LUNGS AND PLEURA: No focal pulmonary opacity. No pleural effusion. No pneumothorax. HEART AND MEDIASTINUM: No acute abnormality of the cardiac and mediastinal silhouettes. BONES AND SOFT TISSUES: No acute osseous abnormality. IMPRESSION: 1. No acute cardiopulmonary process. Electronically signed by: Lynwood Seip MD 04/05/2024 01:25 PM EST RP Workstation: [id number]     Procedures   Medications Ordered in the ED - No data to display              HEART Score: 2                    Medical Decision Making Amount and/or Complexity of Data Reviewed Labs: ordered. Radiology: ordered.     Differential diagnosis includes but is not limited to ACS, arrhythmia, aortic aneurysm, pericarditis, myocarditis, pericardial effusion, cardiac tamponade, musculoskeletal pain, GERD, Boerhaave's syndrome, DVT/PE, pneumonia, pleural effusion   ED Course:  Upon initial evaluation, patient is very well-appearing, no acute distress.  Normal  vital signs.  Regular rate and rhythm on cardiac auscultations.  No murmurs.  Lungs good auscultation bilaterally.  No lower extremity edema or calf tenderness to palpation.   Labs Ordered: I Ordered, and personally interpreted labs.  The pertinent results include:   Initial and repeat troponin within normal limits D-dimer within normal limits CBC within normal limits BMP within normal limits  Imaging Studies ordered: I ordered imaging studies including chest x-ray I independently visualized the imaging with scope of interpretation limited to determining acute life threatening conditions related to emergency care. Imaging showed  no acute abnormalities I agree with the radiologist interpretation   Cardiac Monitoring: / EKG: The patient was maintained on a cardiac monitor.  I personally viewed and interpreted the cardiac monitored which showed an underlying rhythm of: Normal sinus rhythm   Medications Given: None  Upon re-evaluation, patient remains well-appearing with normal vitals.   Low concern for ACS at this time given troponin remains stable with initial troponin of less than 2 and repeat of less than 2, chest pain/pressure non-exertional, and EKG with normal sinus rhythm and no ST changes. HEART score of 2 due to history of hyperlipidemia. Chest x-ray without any acute abnormality such as pleural effusion, consolidations, or widened mediastinum. Low concern for DVT or PE at this time given d-dimer within normal limits and no risk factors for PE. Patient with history of an episode of atrial flutter that has since resolved.  No other significant cardiac history that would warrant further monitoring in the emergency room or inpatient.   Given the intermittent nature that does not seem to be associate with exertion, question if this could be something such as GERD.  Workup here today does not indicate emergent pathology at this time.    Patient stable and appropriate for discharge home  with close follow-up with her PCP.    Impression: Chest pressure  Disposition:  The patient was discharged home with instructions to follow-up with her PCP within the next week. Return precautions given and patient verbalized understanding.   This chart was dictated using voice recognition software, Dragon. Despite the best efforts of this provider to proofread and correct errors, errors may still occur which can change documentation meaning.       Final diagnoses:  Chest tightness    ED Discharge Orders     None          Veta Palma, PA-C 04/05/24 1615    Veta Palma, PA-C 04/05/24 1616    Laurice Maude BROCKS, MD 04/06/24 [id number]

## 2024-04-05 NOTE — Discharge Instructions (Addendum)
 Your workup today is reassuring. It is very unlikely that your pain is due to an issue in your heart.  Your cardiac enzyme (troponin) was normal today. Your EKG which is a measure of the heart's electrical activity and rhythm is normal today. These would both show abnormalities if you were having a heart attack.  No signs of blood clot in your lungs.   Your chest x-ray is normal today.   Please follow-up with your primary provider within the next week for further management and follow-up of your symptoms.  Return to the ER if you have any worsening shortness of breath, difficulty breathing, worsening chest pain, unexplained fever, any other new or concerning symptoms.

## 2024-04-05 NOTE — ED Triage Notes (Signed)
 C/O central chest pain that radiates to left shoulder blade that started Monday. C/O [facility] for 2 days. Axox4. States nausea yesterday.

## 2024-04-07 ENCOUNTER — Ambulatory Visit: Payer: Self-pay

## 2024-04-07 NOTE — Telephone Encounter (Signed)
 FYI Only or Action Required?: FYI only for provider: ED advised. Pt states she was just there on Wednesday and they advised follow up with PCP  Patient was last seen in primary care on 07/20/2023 by Lucius Krabbe, NP.  Called Nurse Triage reporting Chest Pain.  Symptoms began several weeks ago.  Interventions attempted: Nothing.  Symptoms are: unchanged.  Triage Disposition: Go to ED Now (or PCP Triage)  Patient/caregiver understands and will follow disposition?: No, wishes to speak with PCP     Copied from CRM [id number]. Topic: Clinical - Red Word Triage >> Apr 07, 2024  4:43 PM Drema MATSU wrote: Red Word that prompted transfer to Nurse Triage: Patient is still having chest pain pressure and cough.  Patient went to the ER on Wednesday and they can't find anything confirming that she had a heart attack. Reason for Disposition  Taking a deep breath makes pain worse  Answer Assessment - Initial Assessment Questions Pt states she texted Dr. Jodie personally on Wednesday and Dr. Jodie called her back and advised her to go to the Er for her symptoms. She was seen in Er on Wednesday. She continues to have a constant chest pressure 5/10, cough with yellow sputum, pain in left shoulder and hard to catch her breath. She states deep breath initiates the cough. ER PA told her it could be acid reflux but didn't prescribe anything.  Rn explained to patient, understanding that she was still in the ER, but given that she is still having this chest pressure, shortness of breath and left shoulder pain, recommendation is to be re-evaluated in the ER. Pt states understanding but states they ruled out a heart attack and told her to follow up with Dr. Obie stated understanding but advised given symptoms continuing, constant and clinics closed, would recommend eval in the ER. Pt stated understanding.     1. LOCATION: Where does it hurt?       Chest , left shoulder 2. RADIATION: Does the pain go  anywhere else? (e.g., into neck, jaw, arms, back)     Left shoulder 3. ONSET: When did the chest pain begin? (Minutes, hours or days)      Cough has been about 4 weeks 4. PATTERN: Does the pain come and go, or has it been constant since it started?  Does it get worse with exertion?      constant 6. SEVERITY: How bad is the pain?  (e.g., Scale 1-10; mild, moderate, or severe)     5/10 7. CARDIAC RISK FACTORS: Do you have any history of heart problems or risk factors for heart disease? (e.g., angina, prior heart attack; diabetes, high blood pressure, high cholesterol, smoker, or strong family history of heart disease)     no 8. PULMONARY RISK FACTORS: Do you have any history of lung disease?  (e.g., blood clots in lung, asthma, emphysema, birth control pills)     no 9. CAUSE: What do you think is causing the chest pain?     Thinks it is from this cough 10. OTHER SYMPTOMS: Do you have any other symptoms? (e.g., dizziness, nausea, vomiting, sweating, fever, difficulty breathing, cough)       Cough, shortness of breath  Protocols used: Chest Pain-A-AH

## 2024-04-10 ENCOUNTER — Other Ambulatory Visit: Payer: Self-pay | Admitting: Family Medicine

## 2024-04-10 NOTE — Telephone Encounter (Signed)
 Please see triage note and advise on appt in office

## 2024-04-14 ENCOUNTER — Ambulatory Visit: Payer: Self-pay

## 2024-04-14 NOTE — Telephone Encounter (Signed)
 FYI Only or Action Required?: Action required by provider: Pt has an appt scheduled for chest pain on the 17th for chest pain. Pt will need to get a substitute to cover for her. She is hoping that appt can be changed to a little later, so she does not have to get a sub..  Patient was last seen in primary care on 07/20/2023 by Lucius Krabbe, NP.  Called Nurse Triage reporting Chest pain continues. Shoulder and left side of chest.  PT believes this is stress related. Symptoms were decreased over the weekend.  Symptoms began a week ago.  Interventions attempted: Other: Went to ED.  Symptoms are: unchanged.  Triage Disposition: See PCP Within 2 Weeks  Patient/caregiver understands and will follow disposition?:                      Copied from CRM #[id number]. Topic: General - Other >> Apr 14, 2024  2:37 PM Willma R wrote: Reason for CRM: Patient scheduled for 12/17, needs to reschedule to a later time in the day if possible. Doesn't get out of work until 2:30p. Reason for Disposition  Requesting regular office appointment  Answer Assessment - Initial Assessment Questions 1. REASON FOR CALL: What is the main reason for your call? or How can I best help you?     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 2. SYMPTOMS : Do you have any symptoms?      Chest pain - constant  - eased up a bit over the weekend. 3. OTHER QUESTIONS: Do you have any other questions?  Protocols used: Information Only Call - No Triage-A-AH

## 2024-04-17 NOTE — Telephone Encounter (Signed)
 Noted

## 2024-04-19 ENCOUNTER — Ambulatory Visit: Admitting: Family Medicine

## 2024-04-19 ENCOUNTER — Other Ambulatory Visit: Payer: Self-pay | Admitting: Family Medicine

## 2024-04-19 ENCOUNTER — Encounter: Payer: Self-pay | Admitting: Family Medicine

## 2024-04-19 VITALS — BP 124/62 | HR 91 | Temp 98.6°F | Ht 65.0 in | Wt 188.0 lb

## 2024-04-19 DIAGNOSIS — F339 Major depressive disorder, recurrent, unspecified: Secondary | ICD-10-CM

## 2024-04-19 DIAGNOSIS — R0781 Pleurodynia: Secondary | ICD-10-CM | POA: Diagnosis not present

## 2024-04-19 DIAGNOSIS — R0600 Dyspnea, unspecified: Secondary | ICD-10-CM | POA: Diagnosis not present

## 2024-04-19 DIAGNOSIS — F4322 Adjustment disorder with anxiety: Secondary | ICD-10-CM

## 2024-04-19 MED ORDER — ALPRAZOLAM 0.5 MG PO TABS: 0.5000 mg | ORAL_TABLET | Freq: Every day | ORAL | 0 refills | Status: AC | PRN

## 2024-04-19 NOTE — Progress Notes (Signed)
 Subjective  CC:  Chief Complaint  Patient presents with   Shortness of Breath   Chest Pain    Pt stated that she feels like it is stress related bc the pressure is ongoing during the week and ease up during the weekend    HPI: [name] [name] is a 63 y.o. female who presents to the office today to address the problems listed above in the chief complaint. Discussed the use of AI scribe software for clinical note transcription with the patient, who gave verbal consent to proceed.  History of Present Illness [name] [name] is a 63 year old female who presents with chest pain and shortness of breath. I have reviewed recent ED eval for cp: unlikely ACS, neg D dimer, nl cxr. Nl troponins.  Chest pain and dyspnea - Chest pain present for approximately two weeks, beginning early December - Describes sensation as being 'pushed up against the wall' and constant heaviness on her chest - c/o pleuritic chest pain with breathing, unimproved. Also with pain beneath scapula. Unrelated to movement - Pain initially subsided over the weekend, returned Sunday night - Sharp pain located underneath the scapula, constant in nature - Associated shortness of breath, feels winded with exertion - Current sensation described as a 'weight' on her chest, different from prior panic attacks  Respiratory symptoms - Albuterol  inhaler previously prescribed for bronchitis, found helpful for breathing - Currently out of albuterol  inhaler - No history of asthma but WARI - Recurrent episodes of feeling unwell, with improvement by weekends - Green nasal discharge without sinus pain  Sinus and headache symptoms - Frequent sinus infections - Headaches attributed to sinus issues or stress  Musculoskeletal symptoms - Gained ten pounds in the last month - No exercise due to knee and hip pain - History of torn meniscus two years ago - Hip feels as if it is 'going out' - Difficulty with physical activity, including  walking her dog  Psychological symptoms - Experiences anxiety - Intermittent use of Valium 5 mg, finds it ineffective for anxiety and chest pressure - Mood affected, describes having a 'real short' fuse - History of panic attacks, but current chest sensation is different  Occupational stress - Works as an engineer, site - 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 Pleuritic chest pain   2. Dyspnea, unspecified type   3. Chronic recurrent major depressive disorder   4. Adjustment reaction with anxiety      Plan  Assessment and Plan Assessment & Plan Chest pain and dyspnea under evaluation Intermittent sharp chest pain under the scapula, exacerbated by deep breathing, present for two weeks. Differential includes pulmonary embolism, asthma, or anxiety-related symptoms. Previous ER visit showed normal labs and oxygen saturation. Pain is not tender to palpation, suggesting non-musculoskeletal origin. Anxiety may contribute to symptoms, but further evaluation is needed to rule out pulmonary causes. - Ordered chest CT to rule out pulmonary embolism or other pulmonary pathology. - If chest CT is negative, will consider pulmonary function test to evaluate for asthma or other obstructive lung disease. - Use albuterol  inhaler as needed for dyspnea. - current respiratory status is stable  Anxiety disorder Chronic anxiety exacerbated by work-related stress. Current use of Valium is ineffective for anxiety or chest pressure. Discussed potential use of Xanax  for acute anxiety episodes, noting its quicker onset and shorter duration compared  to Valium. Emphasized the importance of addressing stressors and reframing thoughts to manage anxiety. - Prescribed Xanax  for acute anxiety episodes, with caution regarding potential for dependence. - Encouraged  discussion with psychiatrist regarding stress management and anxiety treatment options.  Asthma under evaluation History of wheezing and shortness of breath, particularly during respiratory infections. No formal diagnosis of asthma, but symptoms suggestive of possible asthma or other obstructive lung disease. Albuterol  provides temporary relief, indicating possible bronchospasm. - If chest CT is negative, will order pulmonary function test to assess for asthma or other obstructive lung disease. - Continue use of albuterol  inhaler as needed for wheezing or shortness of breath.  General health maintenance Mammogram was not completed this year due to scheduling issues. Discussed the importance of routine screenings and health maintenance.   I personally spent a total of 48 minutes in the care of the patient today including preparing to see the patient, getting/reviewing separately obtained history, performing a medically appropriate exam/evaluation, counseling and educating, placing orders, documenting clinical information in the EHR, independently interpreting results, communicating results, and coordinating care.  Follow up: prn Orders Placed This Encounter  Procedures   CT Angio Chest Pulmonary Embolism (PE) W or WO Contrast   Pulmonary function test   Meds ordered this encounter  Medications   ALPRAZolam  (XANAX ) 0.5 MG tablet    Sig: Take 1 tablet (0.5 mg total) by mouth daily as needed for anxiety.    Dispense:  30 tablet    Refill:  0     I reviewed the patients updated PMH, FH, and SocHx.  Patient Active Problem List   Diagnosis Date Noted   Attention deficit disorder (ADD) without hyperactivity 12/24/2021    Priority: High   Tardive dyskinesia 06/03/2018    Priority: High   Familial hypercholesteremia 07/26/2015    Priority: High   Family history of premature CAD 07/26/2012    Priority: High   Chronic recurrent major depressive disorder 09/09/2011    Priority: High    Primary osteoarthritis of both hands 12/24/2021    Priority: Medium    Postherpetic neuralgia 06/06/2019    Priority: Medium    Retinal detachment 12/31/2011    Priority: Medium    History of atrial flutter 09/29/2011    Priority: Medium    Atrophic vaginitis 06/03/2018    Priority: Low   AR (allergic rhinitis) 08/01/2010    Priority: Low   Active Medications[1] Allergies: Patient is allergic to wellbutrin [bupropion], aripiprazole, and statins. Family History: Patient family history includes Coronary artery disease in her brother and father; Hypertension in her mother; Stroke in her mother. Social History:  Patient  reports that she quit smoking about 22 years ago. Her smoking use included cigarettes. She has never used smokeless tobacco. She reports current alcohol use of about 2.0 standard drinks of alcohol per week. She reports that she does not use drugs.  Review of Systems: Constitutional: Negative for fever malaise or anorexia Cardiovascular: negative for chest pain Respiratory: negative for SOB or persistent cough Gastrointestinal: negative for abdominal pain  Objective  Vitals: BP 124/62   Pulse 91   Temp 98.6 F (37 C)   Ht 5' 5 (1.651 m)   Wt 188 lb (85.3 kg)   SpO2 95%   BMI 31.28 kg/m  General: no acute distress , A&Ox3 HEENT: PEERL, conjunctiva normal, neck is supple Cardiovascular:  RRR without murmur or gallop.  Respiratory:  Good breath sounds bilaterally, CTAB with normal respiratory effort but c/o pain with  breathing on left, no chest wall ttp Back: no muscle ttp.  Gastrointestinal: soft, flat abdomen, normal active bowel sounds, no palpable masses, no hepatosplenomegaly, no appreciated hernias, nontender Skin:  Warm, no rashes  No visits with results within 1 Day(s) from this visit.  Latest known visit with results is:  Admission on 04/05/2024, Discharged on 04/05/2024  Component Date Value Ref Range Status   Sodium 04/05/2024 139  135 - 145  mmol/L Final   Potassium 04/05/2024 4.0  3.5 - 5.1 mmol/L Final   Chloride 04/05/2024 101  98 - 111 mmol/L Final   CO2 04/05/2024 28  22 - 32 mmol/L Final   Glucose, Bld 04/05/2024 146 (H)  70 - 99 mg/dL Final   BUN 87/96/7974 17  8 - 23 mg/dL Final   Creatinine, Ser 04/05/2024 0.80  0.44 - 1.00 mg/dL Final   Calcium  04/05/2024 9.4  8.9 - 10.3 mg/dL Final   GFR, Estimated 04/05/2024 >60  >60 mL/min Final   Anion gap 04/05/2024 10  5 - 15 Final   WBC 04/05/2024 5.6  4.0 - 10.5 K/uL Final   RBC 04/05/2024 4.27  3.87 - 5.11 MIL/uL Final   Hemoglobin 04/05/2024 13.2  12.0 - 15.0 g/dL Final   HCT 87/96/7974 39.8  36.0 - 46.0 % Final   MCV 04/05/2024 93.2  80.0 - 100.0 fL Final   MCH 04/05/2024 30.9  26.0 - 34.0 pg Final   MCHC 04/05/2024 33.2  30.0 - 36.0 g/dL Final   RDW 87/96/7974 12.6  11.5 - 15.5 % Final   Platelets 04/05/2024 245  150 - 400 K/uL Final   nRBC 04/05/2024 0.0  0.0 - 0.2 % Final   Troponin I (High Sensitivity) 04/05/2024 <2  <18 ng/L Final   D-Dimer, Quant 04/05/2024 0.28  0.00 - 0.50 ug/mL-FEU Final   Troponin I (High Sensitivity) 04/05/2024 <2  <18 ng/L Final    Commons side effects, risks, benefits, and alternatives for medications and treatment plan prescribed today were discussed, and the patient expressed understanding of the given instructions. Patient is instructed to call or message via MyChart if he/she has any questions or concerns regarding our treatment plan. No barriers to understanding were identified. We discussed Red Flag symptoms and signs in detail. Patient expressed understanding regarding what to do in case of urgent or emergency type symptoms.  Medication list was reconciled, printed and provided to the patient in AVS. Patient instructions and summary information was reviewed with the patient as documented in the AVS. This note was prepared with assistance of Dragon voice recognition software. Occasional wrong-word or sound-a-like substitutions may have  occurred due to the inherent limitations of voice recognition software     [1]  Current Meds  Medication Sig   albuterol  (VENTOLIN  HFA) 108 (90 Base) MCG/ACT inhaler Inhale 2 puffs into the lungs every 6 (six) hours as needed for wheezing or shortness of breath.   ALPRAZolam  (XANAX ) 0.5 MG tablet Take 1 tablet (0.5 mg total) by mouth daily as needed for anxiety.   aspirin  81 MG EC tablet Take 81 mg by mouth daily.   atomoxetine (STRATTERA) 25 MG capsule Take 25 mg by mouth every morning.   cholecalciferol (VITAMIN D ) 1000 units tablet Take 1,000 Units by mouth daily.   diclofenac  Sodium (VOLTAREN ) 1 % GEL Apply 2 g topically 4 (four) times daily as needed (hand pain).   ezetimibe  (ZETIA ) 10 MG tablet TAKE ONE TABLET BY MOUTH AT BEDTIME   Multiple Vitamin (MULTIVITAMIN) tablet  Take 1 tablet by mouth daily.   Omega-3 Fatty Acids (FISH OIL) 1000 MG CAPS Take by mouth.   rosuvastatin  (CRESTOR ) 10 MG tablet Take 1 tablet (10 mg total) by mouth daily.   traZODone (DESYREL) 50 MG tablet Take by mouth.   venlafaxine  XR (EFFEXOR -XR) 37.5 MG 24 hr capsule Take 112.5 mg by mouth every morning.   [DISCONTINUED] azithromycin  (ZITHROMAX ) 250 MG tablet Take 2 tabs today, then 1 tab daily for 4 days   [DISCONTINUED] benzonatate  (TESSALON ) 100 MG capsule Take 1-2 capsules (100-200 mg total) by mouth 2 (two) times daily as needed for cough.   [DISCONTINUED] diazepam (VALIUM) 5 MG tablet Take 2.5-5 mg by mouth daily.   [DISCONTINUED] predniSONE  (DELTASONE ) 10 MG tablet Take with breakfast:  5 tab day 1, 4 tab day 2-3, 3 tab day 4, 2 tab day 5-7

## 2024-04-21 ENCOUNTER — Inpatient Hospital Stay
Admission: RE | Admit: 2024-04-21 | Discharge: 2024-04-21 | Disposition: A | Source: Ambulatory Visit | Attending: Family Medicine | Admitting: Family Medicine

## 2024-04-21 DIAGNOSIS — R0600 Dyspnea, unspecified: Secondary | ICD-10-CM

## 2024-04-21 DIAGNOSIS — R0781 Pleurodynia: Secondary | ICD-10-CM

## 2024-04-21 MED ORDER — IOPAMIDOL (ISOVUE-370) INJECTION 76%
75.0000 mL | Freq: Once | INTRAVENOUS | Status: AC | PRN
  Administered 2024-04-21: 75 mL via INTRAVENOUS

## 2024-04-25 ENCOUNTER — Ambulatory Visit: Admitting: Family Medicine

## 2024-06-08 ENCOUNTER — Other Ambulatory Visit: Payer: Self-pay | Admitting: Medical Genetics

## 2024-06-08 DIAGNOSIS — Z006 Encounter for examination for normal comparison and control in clinical research program: Secondary | ICD-10-CM
